# Patient Record
Sex: Female | Born: 1985 | Hispanic: Yes | Marital: Single | Smoking: Former smoker
Health system: Southern US, Community
[De-identification: ages and names within clinical notes are randomized; demographics above are authoritative.]

## PROBLEM LIST (undated history)

## (undated) DIAGNOSIS — F419 Anxiety disorder, unspecified: Secondary | ICD-10-CM

## (undated) DIAGNOSIS — J45909 Unspecified asthma, uncomplicated: Secondary | ICD-10-CM

## (undated) DIAGNOSIS — R519 Headache, unspecified: Secondary | ICD-10-CM

## (undated) DIAGNOSIS — F329 Major depressive disorder, single episode, unspecified: Secondary | ICD-10-CM

## (undated) DIAGNOSIS — T753XXA Motion sickness, initial encounter: Secondary | ICD-10-CM

## (undated) DIAGNOSIS — K219 Gastro-esophageal reflux disease without esophagitis: Secondary | ICD-10-CM

## (undated) DIAGNOSIS — K900 Celiac disease: Secondary | ICD-10-CM

## (undated) DIAGNOSIS — R51 Headache: Secondary | ICD-10-CM

## (undated) DIAGNOSIS — R011 Cardiac murmur, unspecified: Secondary | ICD-10-CM

## (undated) DIAGNOSIS — D649 Anemia, unspecified: Secondary | ICD-10-CM

## (undated) DIAGNOSIS — R569 Unspecified convulsions: Secondary | ICD-10-CM

## (undated) DIAGNOSIS — R87629 Unspecified abnormal cytological findings in specimens from vagina: Secondary | ICD-10-CM

## (undated) DIAGNOSIS — F32A Depression, unspecified: Secondary | ICD-10-CM

## (undated) HISTORY — DX: Unspecified abnormal cytological findings in specimens from vagina: R87.629

## (undated) HISTORY — PX: GALLBLADDER SURGERY: SHX652

## (undated) HISTORY — PX: NO PAST SURGERIES: SHX2092

## (undated) HISTORY — PX: OTHER SURGICAL HISTORY: SHX169

---
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
## 2012-03-12 ENCOUNTER — Emergency Department: Payer: Self-pay | Admitting: Emergency Medicine

## 2012-03-12 LAB — COMPREHENSIVE METABOLIC PANEL
Albumin: 3.6 g/dL (ref 3.4–5.0)
Alkaline Phosphatase: 133 U/L (ref 50–136)
Anion Gap: 6 — ABNORMAL LOW (ref 7–16)
BUN: 9 mg/dL (ref 7–18)
Calcium, Total: 8.8 mg/dL (ref 8.5–10.1)
Co2: 28 mmol/L (ref 21–32)
Creatinine: 0.68 mg/dL (ref 0.60–1.30)
EGFR (African American): >60
EGFR (Non-African Amer.): >60
Glucose: 105 mg/dL — ABNORMAL HIGH (ref 65–99)
Potassium: 3.8 mmol/L (ref 3.5–5.1)
SGOT(AST): 23 U/L (ref 15–37)
SGPT (ALT): 38 U/L
Sodium: 139 mmol/L (ref 136–145)
Total Protein: 7.8 g/dL (ref 6.4–8.2)

## 2012-03-12 LAB — CBC WITH DIFFERENTIAL/PLATELET
Eosinophil #: 0 10*3/uL (ref 0.0–0.7)
HCT: 34.4 % — ABNORMAL LOW (ref 35.0–47.0)
HGB: 10.6 g/dL — ABNORMAL LOW (ref 12.0–16.0)
Lymphocyte %: 15 %
MCH: 24.1 pg — ABNORMAL LOW (ref 26.0–34.0)
MCHC: 30.7 g/dL — ABNORMAL LOW (ref 32.0–36.0)
Monocyte #: 0.5 x10 3/mm (ref 0.2–0.9)
Platelet: 371 10*3/uL (ref 150–440)
RDW: 15.5 % — ABNORMAL HIGH (ref 11.5–14.5)

## 2012-03-12 LAB — URINALYSIS, COMPLETE
Bilirubin,UR: NEGATIVE
Glucose,UR: NEGATIVE mg/dL (ref 0–75)
Ketone: NEGATIVE
Nitrite: NEGATIVE
Ph: 6 (ref 4.5–8.0)
RBC,UR: 2 /HPF (ref 0–5)
Squamous Epithelial: 1
WBC UR: 5 /HPF (ref 0–5)

## 2012-03-12 LAB — PREGNANCY, URINE: Pregnancy Test, Urine: NEGATIVE m[IU]/mL

## 2012-04-08 ENCOUNTER — Emergency Department: Payer: Self-pay | Admitting: Internal Medicine

## 2013-07-08 IMAGING — CT CT HEAD WITHOUT CONTRAST
2 series · 16 of 30 positions shown, 20 images · non-contrast
Comparison: none

REASON FOR EXAM: ? seizure activity, LOC +
COMMENTS:   LMP: couple of days, negative hcg

[Series 2: without · axial · non-contrast · 0.39mm/px · z∈[-136,-16]mm · 13 of 28 slices shown, 17 images]
[im 2/28  brain]
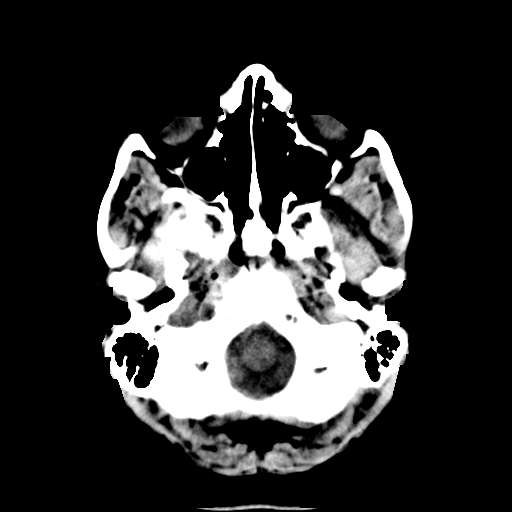
[im 2/28  bone]
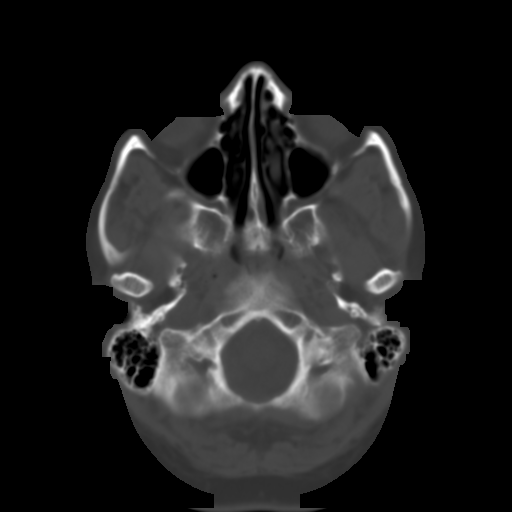
[im 4/28  brain]
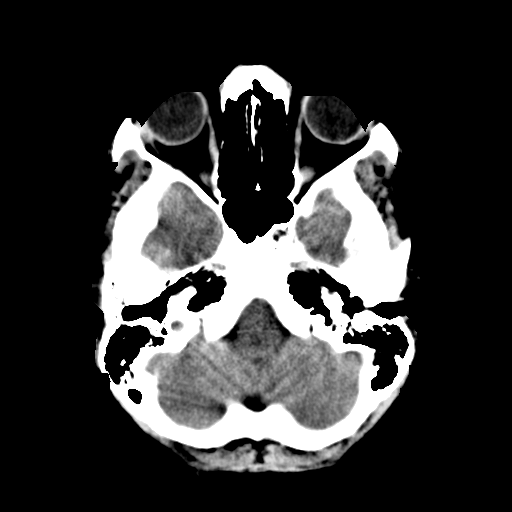
[im 6/28  brain]
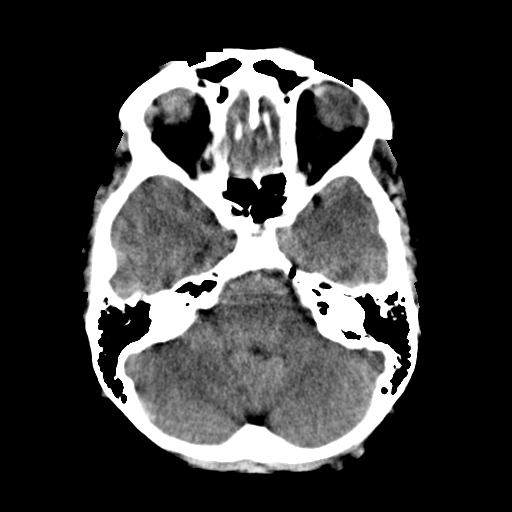
[im 8/28  brain]
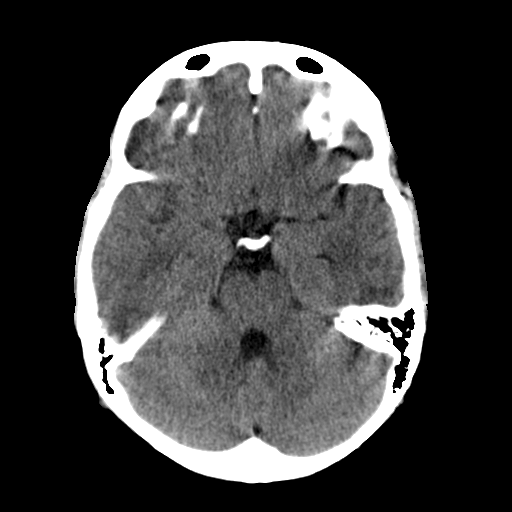
[im 10/28  brain]
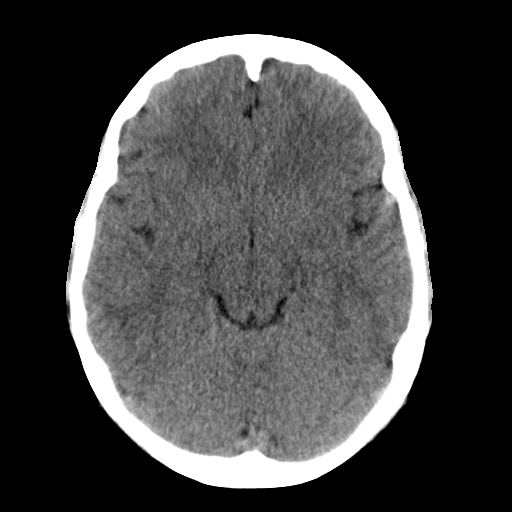
[im 10/28  bone]
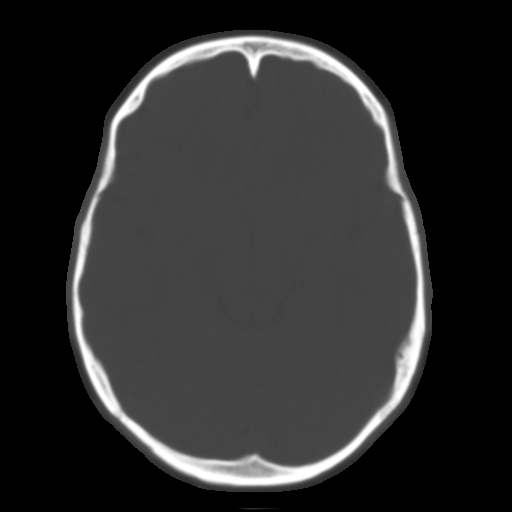
[im 12/28  brain]
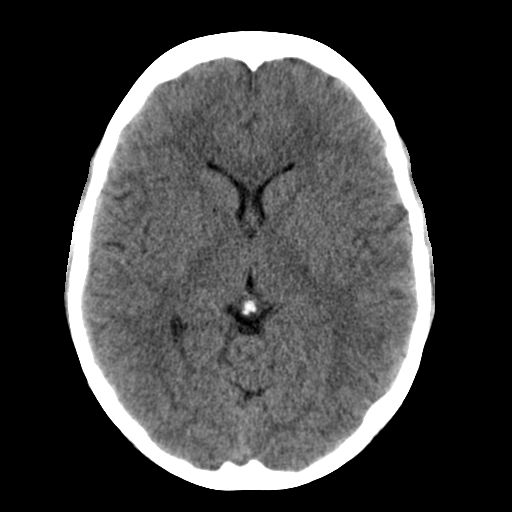
[im 14/28  brain]
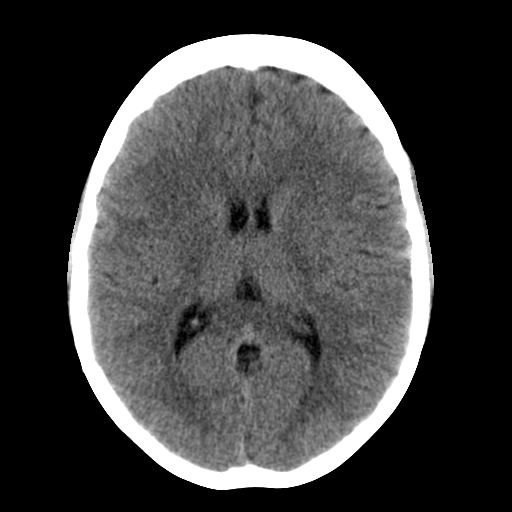
[im 16/28  brain]
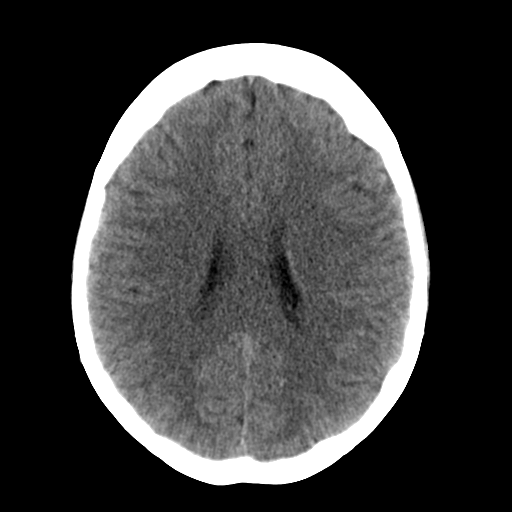
[im 18/28  brain]
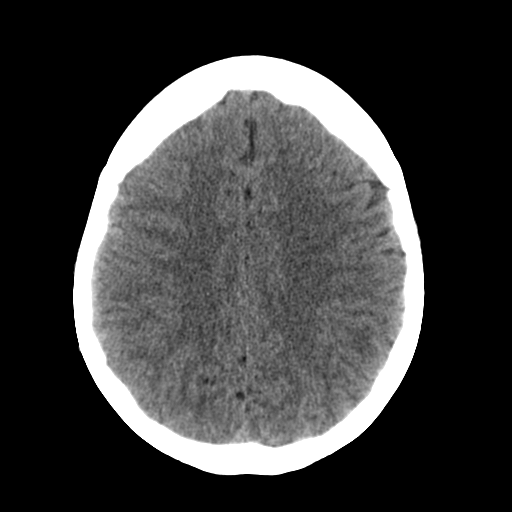
[im 18/28  bone]
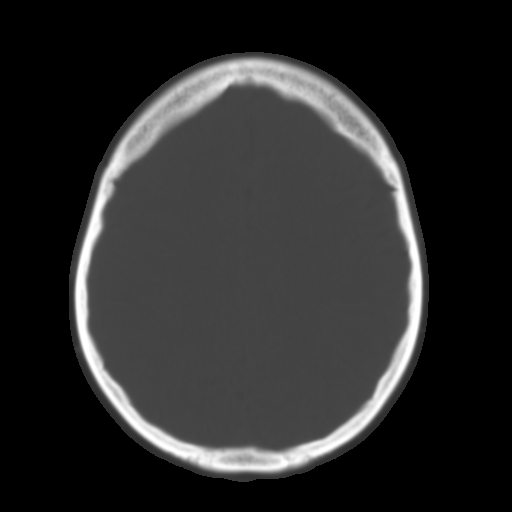
[im 20/28  brain]
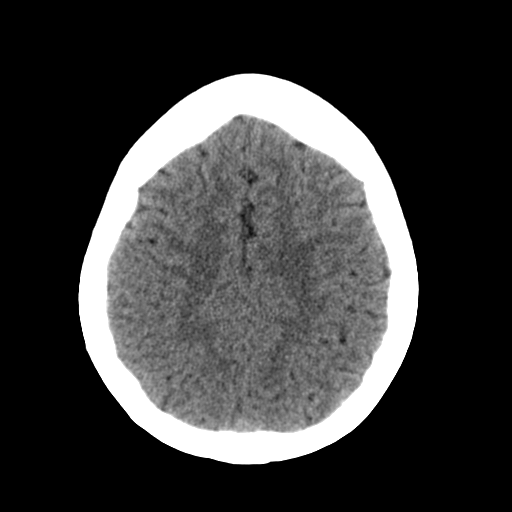
[im 22/28  brain]
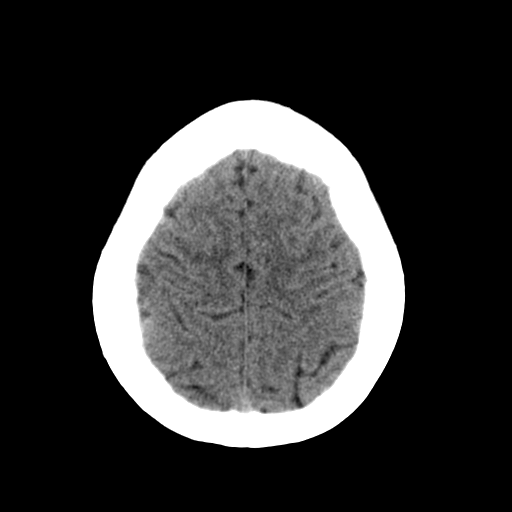
[im 24/28  brain]
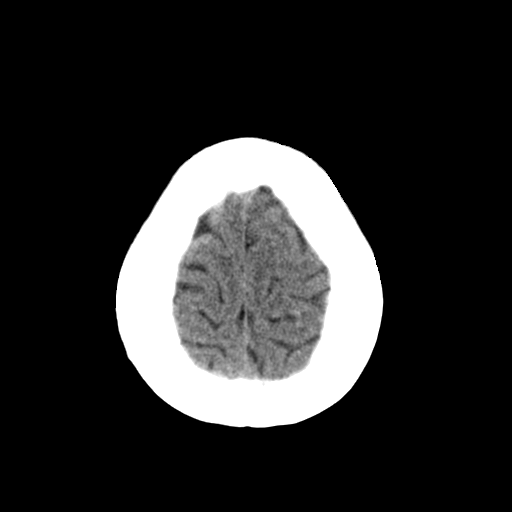
[im 26/28  brain]
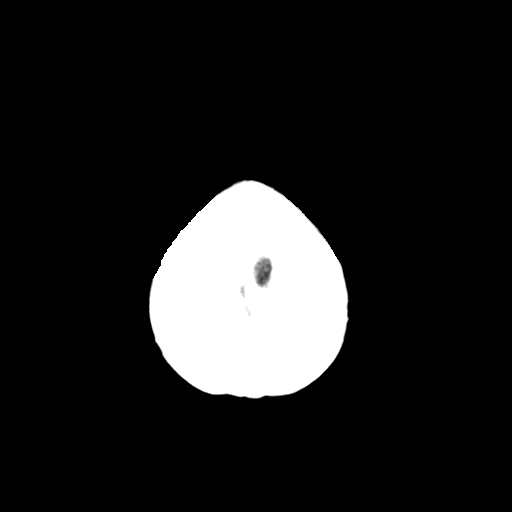
[im 26/28  bone]
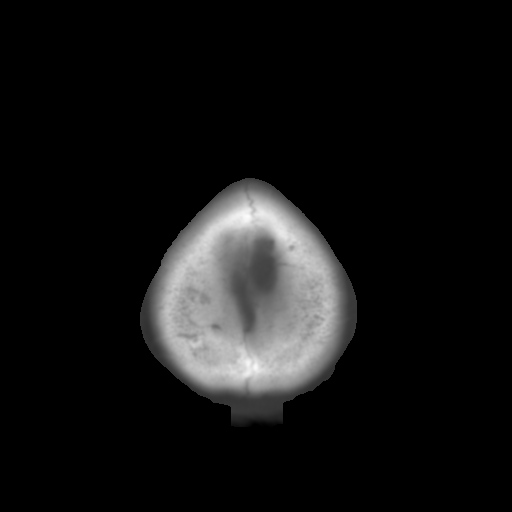

[Series 3: bone · axial · 0.39mm/px · z∈[-136,-96]mm · 3 of 28 slices shown]
[im 2/28  bone]
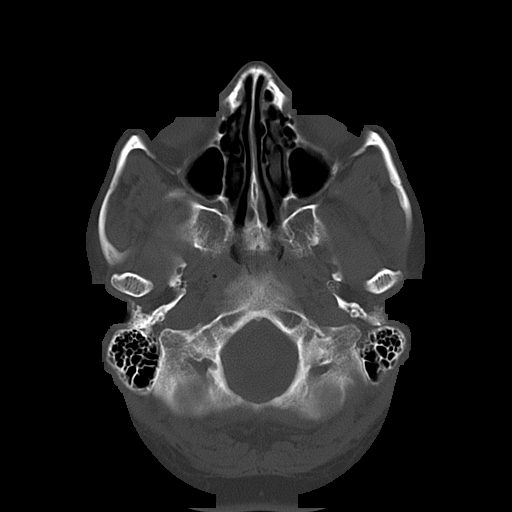
[im 6/28  bone]
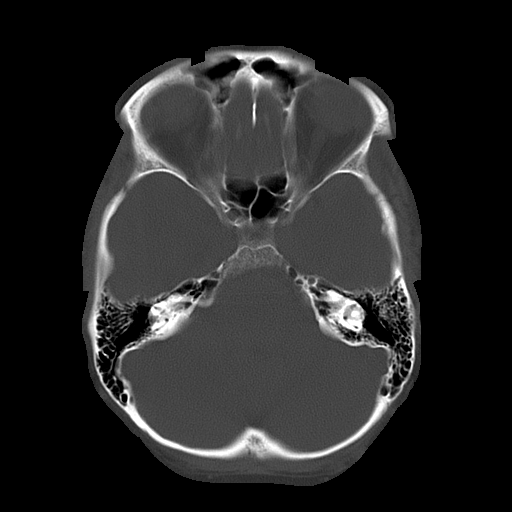
[im 10/28  bone]
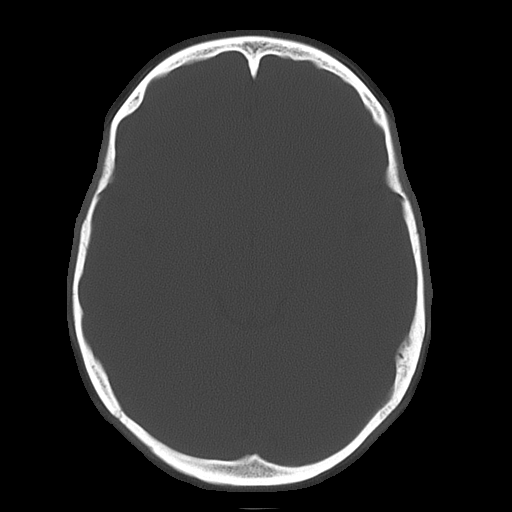

[16 of 30 positions shown; findings below may reference images not displayed]

PROCEDURE:     CT  - CT HEAD WITHOUT CONTRAST  - March 12, 2012  [DATE]

RESULT:     The patient has no previous exam for comparison.

The ventricles and sulci are normal. There is no hemorrhage. There is no
focal mass, mass-effect or midline shift. There is no evidence of edema or
territorial infarct. The bone windows demonstrate normal aeration of the
paranasal sinuses and mastoid air cells. There is no skull fracture
demonstrated.
IMPRESSION: 1. No acute intracranial abnormality.

[REDACTED]

## 2014-08-29 ENCOUNTER — Emergency Department: Payer: Self-pay | Admitting: Emergency Medicine

## 2014-12-22 ENCOUNTER — Emergency Department: Payer: Self-pay | Admitting: Emergency Medicine

## 2015-01-02 LAB — BETA STREP CULTURE(ARMC)

## 2016-04-18 IMAGING — CR DG CHEST 2V
1 series · 2 of 2 positions shown · non-contrast
Comparison: None.

CLINICAL DATA: Productive cough for 3 days. Two day history of
chest pain

EXAM:
CHEST  2 VIEW

[Series 1: dxr chest pa (or ap) and lateral · 0.14mm/px · 2 of 2 slices shown]
[im 1/2]
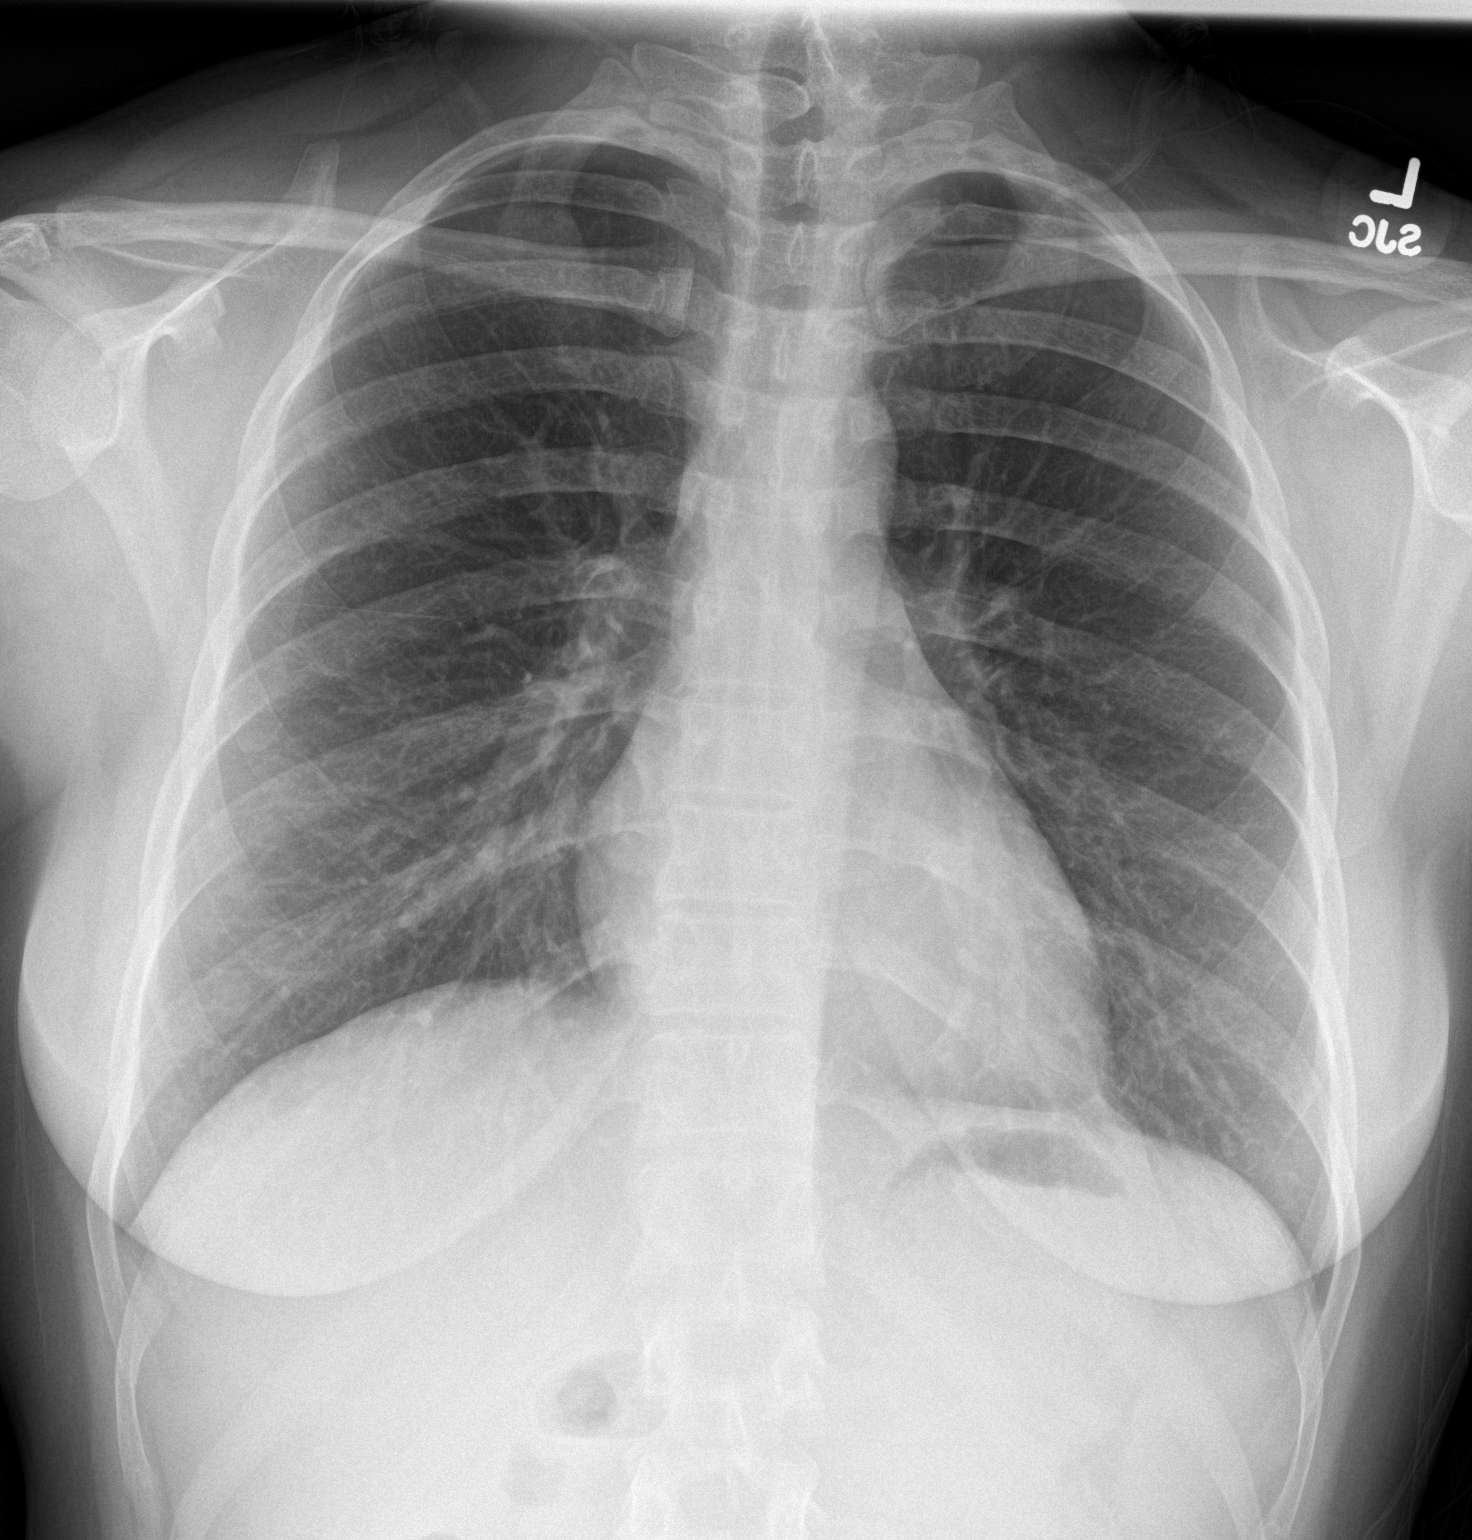
[im 2/2]
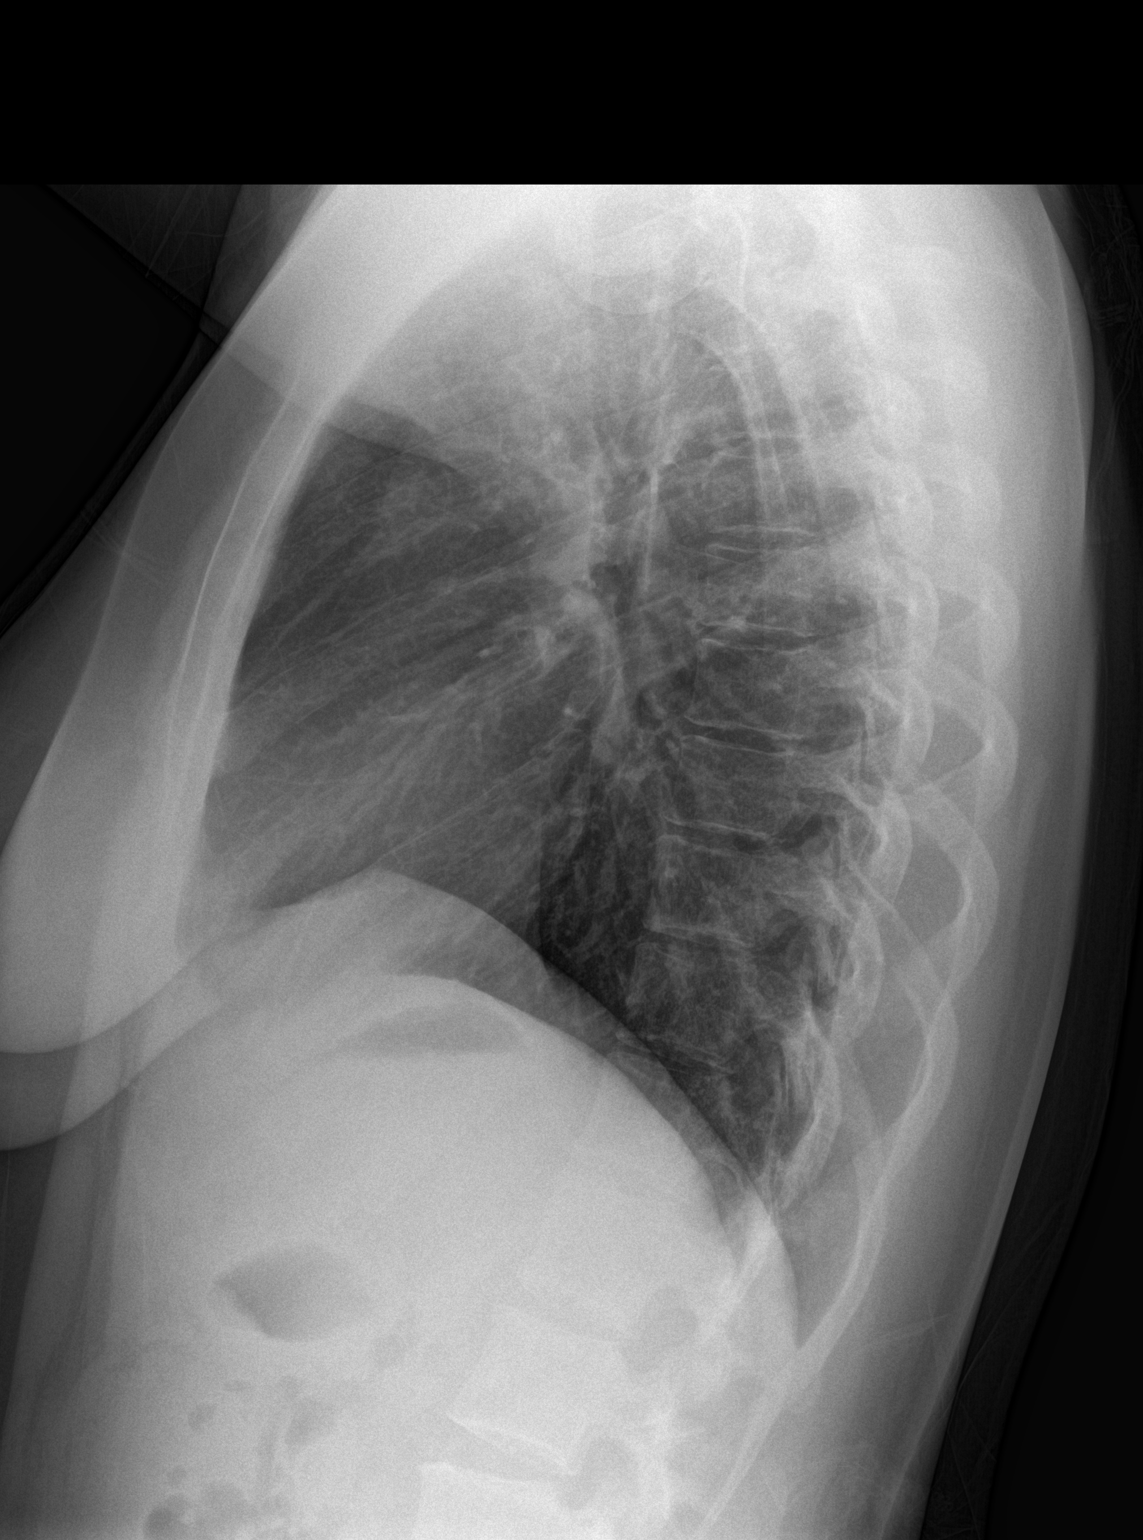

[2 of 2 positions shown; findings below may reference images not displayed]

FINDINGS: Lungs are clear. Heart size and pulmonary vascularity are normal. No
adenopathy. No pneumothorax. No bone lesions.
IMPRESSION: No edema or consolidation.

## 2018-03-29 ENCOUNTER — Emergency Department
Admission: EM | Admit: 2018-03-29 | Discharge: 2018-03-29 | Disposition: A | Discharge status: Home / Self Care | Payer: Medicaid Other | Attending: Emergency Medicine | Admitting: Emergency Medicine

## 2018-03-29 ENCOUNTER — Other Ambulatory Visit: Payer: Self-pay

## 2018-03-29 ENCOUNTER — Encounter: Payer: Self-pay | Admitting: *Deleted

## 2018-03-29 ENCOUNTER — Emergency Department: Payer: Medicaid Other

## 2018-03-29 DIAGNOSIS — O210 Mild hyperemesis gravidarum: Secondary | ICD-10-CM

## 2018-03-29 DIAGNOSIS — R824 Acetonuria: Secondary | ICD-10-CM | POA: Diagnosis not present

## 2018-03-29 DIAGNOSIS — O219 Vomiting of pregnancy, unspecified: Secondary | ICD-10-CM

## 2018-03-29 DIAGNOSIS — O26831 Pregnancy related renal disease, first trimester: Secondary | ICD-10-CM | POA: Diagnosis not present

## 2018-03-29 LAB — CBC WITH DIFFERENTIAL/PLATELET
BASOS ABS: 0.1 10*3/uL (ref 0–0.1)
Basophils Relative: 0 %
EOS PCT: 1 %
Eosinophils Absolute: 0.1 10*3/uL (ref 0–0.7)
HCT: 37.2 % (ref 35.0–47.0)
Hemoglobin: 12.6 g/dL (ref 12.0–16.0)
LYMPHS ABS: 2.4 10*3/uL (ref 1.0–3.6)
Lymphocytes Relative: 18 %
MCH: 29.3 pg (ref 26.0–34.0)
MCHC: 33.9 g/dL (ref 32.0–36.0)
MCV: 86.5 fL (ref 80.0–100.0)
Monocytes Absolute: 0.7 10*3/uL (ref 0.2–0.9)
Monocytes Relative: 5 %
NEUTROS ABS: 10.5 10*3/uL — AB (ref 1.4–6.5)
NEUTROS PCT: 76 %
Platelets: 371 10*3/uL (ref 150–440)
RBC: 4.3 MIL/uL (ref 3.80–5.20)
RDW: 14.4 % (ref 11.5–14.5)
WBC: 13.8 10*3/uL — AB (ref 3.6–11.0)

## 2018-03-29 LAB — URINALYSIS, COMPLETE (UACMP) WITH MICROSCOPIC
Bilirubin Urine: NEGATIVE
GLUCOSE, UA: NEGATIVE mg/dL
Hgb urine dipstick: NEGATIVE
KETONES UR: 20 mg/dL — AB
Leukocytes, UA: NEGATIVE
Nitrite: NEGATIVE
PH: 6 (ref 5.0–8.0)
PROTEIN: NEGATIVE mg/dL
Specific Gravity, Urine: 1.012 (ref 1.005–1.030)

## 2018-03-29 LAB — COMPREHENSIVE METABOLIC PANEL
ALK PHOS: 67 U/L (ref 38–126)
ALT: 17 U/L (ref 0–44)
AST: 21 U/L (ref 15–41)
Albumin: 4.1 g/dL (ref 3.5–5.0)
Anion gap: 9 (ref 5–15)
BUN: 8 mg/dL (ref 6–20)
CALCIUM: 8.9 mg/dL (ref 8.9–10.3)
CHLORIDE: 101 mmol/L (ref 98–111)
CO2: 22 mmol/L (ref 22–32)
CREATININE: 0.56 mg/dL (ref 0.44–1.00)
Glucose, Bld: 92 mg/dL (ref 70–99)
Potassium: 3.2 mmol/L — ABNORMAL LOW (ref 3.5–5.1)
Sodium: 132 mmol/L — ABNORMAL LOW (ref 135–145)
Total Bilirubin: 0.6 mg/dL (ref 0.3–1.2)
Total Protein: 7.8 g/dL (ref 6.5–8.1)

## 2018-03-29 LAB — PREGNANCY, URINE: Preg Test, Ur: POSITIVE — AB

## 2018-03-29 LAB — LIPASE, BLOOD: LIPASE: 26 U/L (ref 11–51)

## 2018-03-29 LAB — HCG, QUANTITATIVE, PREGNANCY: HCG, BETA CHAIN, QUANT, S: 135811 m[IU]/mL — AB (ref <5)

## 2018-03-29 MED ORDER — METOCLOPRAMIDE HCL 10 MG PO TABS: 10.0000 mg | ORAL_TABLET | Freq: Three times a day (TID) | ORAL | 0 refills | Status: DC | PRN

## 2018-03-29 MED ORDER — DEXTROSE-NACL 5-0.9 % IV SOLN
INTRAVENOUS | Status: AC
  Administered 2018-03-29: 20:00:00 via INTRAVENOUS

## 2018-03-29 MED ORDER — METOCLOPRAMIDE HCL 5 MG/ML IJ SOLN
10.0000 mg | Freq: Once | INTRAMUSCULAR | Status: AC
  Administered 2018-03-29: 10 mg via INTRAVENOUS
  Filled 2018-03-29: qty 2

## 2018-03-29 NOTE — ED Notes (Signed)
Pt given apple juice and graham crackers per request. Told pt to go slow to tolerate.

## 2018-03-29 NOTE — ED Triage Notes (Signed)
First Nurse Note:  Arrives c/o nausea and vomiting with pregnancy.  States she does not know how far along she is. States LMP in April 2019.  G5 P3.  Has not been able to schedule an appointment with OB yet.  Denies vaginal bleeding.  C/O intermittent low back and abdominal pain.

## 2018-03-29 NOTE — ED Triage Notes (Signed)
Pt ambulatory to triage.  Pt has nausea and vomiting.  Pt is pregnant.  [id number]  Sx for for 1 week.  No vag bleeding .  Pt has abd pain with low back pain also.  Pt alert.

## 2018-03-29 NOTE — ED Provider Notes (Signed)
[facility] Emergency Department Provider Note  ____________________________________________  Time seen: Approximately 6:07 PM  I have reviewed the triage vital signs and the nursing notes.   HISTORY  Chief Complaint Morning Sickness   HPI Angie Schmidt is a 32 y.o. female no significant past medical history presents for evaluation of nausea and vomiting.  Patient reports that her last menstrual period was in the end of April.  She took a home pregnancy test which was positive.  She has not establish care for this pregnancy.  This is patient's fifth pregnancy.  She has 3 children and her last pregnancy ended up in a abortion.  Patient reports for the last week she has been unable to keep anything down.  Every time she eats or drinks she vomits.  She has had constant nausea. She has been having intermittent mild lower abdominal cramping for one month,  none at this time.  No vaginal bleeding.  No fever or chills, no dysuria or hematuria, no vaginal discharge.  Patient has not established care or had an ultrasound for this pregnancy yet.  PMH None  Allergies Patient has no known allergies.  FH Reviewed and non contributory  Social History Social History   Tobacco Use  . Smoking status: Never Smoker  . Smokeless tobacco: Never Used  Substance Use Topics  . Alcohol use: Never    Frequency: Never  . Drug use: Never    Review of Systems  Constitutional: Negative for fever. Eyes: Negative for visual changes. ENT: Negative for sore throat. Neck: No neck pain  Cardiovascular: Negative for chest pain. Respiratory: Negative for shortness of breath. Gastrointestinal: + abdominal pain, nausea, and vomiting. No diarrhea. Genitourinary: Negative for dysuria. Musculoskeletal: Negative for back pain. Skin: Negative for rash. Neurological: Negative for headaches, weakness or numbness. Psych: No SI or  HI  ____________________________________________   PHYSICAL EXAM:  VITAL SIGNS: ED Triage Vitals  Enc Vitals Group     BP 03/29/18 1754 117/60     Pulse Rate 03/29/18 1754 71     Resp 03/29/18 1754 20     Temp 03/29/18 1754 99.7 F (37.6 C)     Temp Source 03/29/18 1754 Oral     SpO2 03/29/18 1754 100 %     Weight 03/29/18 1755 157 lb (71.2 kg)     Height 03/29/18 1755 5' (1.524 m)     Head Circumference --      Peak Flow --      Pain Score --      Pain Loc --      Pain Edu? --      Excl. in GC? --     Constitutional: Alert and oriented. Well appearing and in no apparent distress. HEENT:      Head: Normocephalic and atraumatic.         Eyes: Conjunctivae are normal. Sclera is non-icteric.       Mouth/Throat: Mucous membranes are moist.       Neck: Supple with no signs of meningismus. Cardiovascular: Regular rate and rhythm. No murmurs, gallops, or rubs. 2+ symmetrical distal pulses are present in all extremities. No JVD. Respiratory: Normal respiratory effort. Lungs are clear to auscultation bilaterally. No wheezes, crackles, or rhonchi.  Gastrointestinal: Soft, non tender, and non distended with positive bowel sounds. No rebound or guarding. Genitourinary: No CVA tenderness. Musculoskeletal: Nontender with normal range of motion in all extremities. No edema, cyanosis, or erythema of extremities. Neurologic: Normal speech and language. Face  is symmetric. Moving all extremities. No gross focal neurologic deficits are appreciated. Skin: Skin is warm, dry and intact. No rash noted. Psychiatric: Mood and affect are normal. Speech and behavior are normal.  ____________________________________________   LABS (all labs ordered are listed, but only abnormal results are displayed)  Labs Reviewed  HCG, QUANTITATIVE, PREGNANCY - Abnormal; Notable for the following components:      Result Value   hCG, Beta Chain, Quant, S 135,811 (*)    All other components within normal limits   CBC WITH DIFFERENTIAL/PLATELET - Abnormal; Notable for the following components:   WBC 13.8 (*)    Neutro Abs 10.5 (*)    All other components within normal limits  COMPREHENSIVE METABOLIC PANEL - Abnormal; Notable for the following components:   Sodium 132 (*)    Potassium 3.2 (*)    All other components within normal limits  URINALYSIS, COMPLETE (UACMP) WITH MICROSCOPIC - Abnormal; Notable for the following components:   Color, Urine YELLOW (*)    APPearance CLEAR (*)    Ketones, ur 20 (*)    Bacteria, UA RARE (*)    All other components within normal limits  PREGNANCY, URINE - Abnormal; Notable for the following components:   Preg Test, Ur POSITIVE (*)    All other components within normal limits  LIPASE, BLOOD   ____________________________________________  EKG  none  ____________________________________________  RADIOLOGY  I have personally reviewed the images performed during this visit and I agree with the Radiologist's read.   Interpretation by Radiologist:  [place] Ob Comp Less 14 Wks  Result Date: 03/29/2018 CLINICAL DATA:  Initial evaluation for acute lower abdominal pain, early pregnancy. EXAM: OBSTETRIC <14 WK US AND TRANSVAGINAL OB US TECHNIQUE: Both transabdominal and transvaginal ultrasound examinations were performed for complete evaluation of the gestation as well as the maternal uterus, adnexal regions, and pelvic cul-de-sac. Transvaginal technique was performed to assess early pregnancy. COMPARISON:  None. FINDINGS: Intrauterine gestational sac: Single Yolk sac:  Present Embryo:  Present Cardiac Activity: Present Heart Rate: 155 bpm CRL:  12.4 mm   7 w   3 d                  US EDC: 11/12/2018 Subchorionic hemorrhage:  None visualized. Maternal uterus/adnexae: Ovaries are normal in appearance bilaterally. Corpus luteal cyst noted on the left. No free fluid. IMPRESSION: 1. Single viable intrauterine pregnancy without complication, estimated gestational age [redacted] weeks  and 3 days by crown-rump length. 2. No other acute maternal uterine or adnexal abnormality identified. Electronically Signed   By: Rise MuBenjamin  McClintock M.D.   On: 03/29/2018 19:31   [place] Ob Transvaginal  Result Date: 03/29/2018 CLINICAL DATA:  Initial evaluation for acute lower abdominal pain, early pregnancy. EXAM: OBSTETRIC <14 WK US AND TRANSVAGINAL OB US TECHNIQUE: Both transabdominal and transvaginal ultrasound examinations were performed for complete evaluation of the gestation as well as the maternal uterus, adnexal regions, and pelvic cul-de-sac. Transvaginal technique was performed to assess early pregnancy. COMPARISON:  None. FINDINGS: Intrauterine gestational sac: Single Yolk sac:  Present Embryo:  Present Cardiac Activity: Present Heart Rate: 155 bpm CRL:  12.4 mm   7 w   3 d                  US EDC: 11/12/2018 Subchorionic hemorrhage:  None visualized. Maternal uterus/adnexae: Ovaries are normal in appearance bilaterally. Corpus luteal cyst noted on the left. No free fluid. IMPRESSION: 1. Single viable intrauterine pregnancy without complication, estimated  gestational age [redacted] weeks and 3 days by crown-rump length. 2. No other acute maternal uterine or adnexal abnormality identified. Electronically Signed   By: Rise Mu M.D.   On: 03/29/2018 19:31      ____________________________________________   PROCEDURES  Procedure(s) performed: None Procedures Critical Care performed:  None ____________________________________________   INITIAL IMPRESSION / ASSESSMENT AND PLAN / ED COURSE  32 y.o. female no significant past medical history presents for evaluation of nausea and vomiting.  Patient with a positive home pregnancy test, LMP end of April, has not had ultrasound to establish care for this pregnancy.  Has been having intermittent lower abdominal cramping but no tenderness or pain at this time, normal vital signs.  No vaginal bleeding.  Patient has had nausea and vomiting for a  week.  Differential diagnosis includes morning sickness versus hyperemesis versus gastritis.  With no tenderness or current pain in her abdomen less likely to be gallbladder, appendicitis, diverticulitis, or pancreatitis.  Will check basic labs, UA, and a transvaginal ultrasound to confirm IUP and rule out ectopic.    _________________________ 8:57 PM on 03/29/2018 -----------------------------------------  UA showing mild ketonuria.  Patient received a liter of D5 normal and Reglan.  She is tolerating p.o.  Labs are within normal limits.  Ultrasound confirms an IUP with gestational age of [redacted] weeks and 5 days.  Patient will be referred to her OB/GYN for further care.  Will provide patient with Reglan for nausea.  Discussed return precautions for vaginal bleeding, signs of dehydration.   As part of my medical decision making, I reviewed the following data within the electronic MEDICAL RECORD NUMBER Nursing notes reviewed and incorporated, Labs reviewed , Radiograph reviewed , Notes from prior ED visits and [place] Controlled Substance Database    Pertinent labs & imaging results that were available during my care of the patient were reviewed by me and considered in my medical decision making (see chart for details).    ____________________________________________   FINAL CLINICAL IMPRESSION(S) / ED DIAGNOSES  Final diagnoses:  Nausea and vomiting during pregnancy  Ketonuria  Morning sickness      NEW MEDICATIONS STARTED DURING THIS VISIT:  ED Discharge Orders        Ordered    metoCLOPramide (REGLAN) 10 MG tablet  Every 8 hours PRN     03/29/18 2056       Note:  This document was prepared using Dragon voice recognition software and may include unintentional dictation errors.    Don Perking, [place], MD 03/29/18 2100

## 2018-10-03 NOTE — L&D Delivery Note (Signed)
Delivery Note At 10:03 AM a viable and healthy female "Carmelo" was delivered via Vaginal, Spontaneous (Presentation: ROA).  APGAR: , ; weight - pending Placenta status: spontaneous.  Cord:  3VC with the following complications: none.  Anesthesia:  None Episiotomy: None Lacerations: None Est. Blood Loss (mL):  300  Mom to postpartum.  Baby to Couplet care / Skin to Skin.  32yo [id number] at 39+1wks with iol for cholestasis of pregnancy. Received GBS ppx and cytotec. SROM for clear fluid and progressed to fully dilated. She pushed over intact perineum and delivered easily without laceration. Head followed quickly and baby placed on maternal abdomen, where he cried after stimulation. Her placenta delivered with fundal massage and postpartum pitocin was given promptly. Her bleeding was minimal and there were no postpartum tears. She tolerated it well.  Christeen DouglasBethany Ramsha Lonigro 11/06/2018, 10:18 AM

## 2018-11-05 ENCOUNTER — Other Ambulatory Visit: Payer: Self-pay

## 2018-11-05 ENCOUNTER — Inpatient Hospital Stay
Admission: EM | Admit: 2018-11-05 | Discharge: 2018-11-08 | DRG: 805 | Disposition: A | Discharge status: Home / Self Care | Payer: Medicaid Other | Attending: Obstetrics and Gynecology | Admitting: Obstetrics and Gynecology

## 2018-11-05 DIAGNOSIS — Z3A39 39 weeks gestation of pregnancy: Secondary | ICD-10-CM

## 2018-11-05 DIAGNOSIS — O9081 Anemia of the puerperium: Secondary | ICD-10-CM | POA: Diagnosis not present

## 2018-11-05 DIAGNOSIS — O26643 Intrahepatic cholestasis of pregnancy, third trimester: Secondary | ICD-10-CM

## 2018-11-05 DIAGNOSIS — D62 Acute posthemorrhagic anemia: Secondary | ICD-10-CM | POA: Diagnosis not present

## 2018-11-05 DIAGNOSIS — K831 Obstruction of bile duct: Secondary | ICD-10-CM | POA: Diagnosis present

## 2018-11-05 DIAGNOSIS — O26613 Liver and biliary tract disorders in pregnancy, third trimester: Secondary | ICD-10-CM

## 2018-11-05 DIAGNOSIS — O99824 Streptococcus B carrier state complicating childbirth: Secondary | ICD-10-CM | POA: Diagnosis present

## 2018-11-05 DIAGNOSIS — O2662 Liver and biliary tract disorders in childbirth: Secondary | ICD-10-CM | POA: Diagnosis present

## 2018-11-05 HISTORY — DX: Liver and biliary tract disorders in pregnancy, third trimester: O26.613

## 2018-11-05 HISTORY — DX: Intrahepatic cholestasis of pregnancy, third trimester: O26.643

## 2018-11-05 HISTORY — DX: Obstruction of bile duct: K83.1

## 2018-11-05 LAB — PROTEIN / CREATININE RATIO, URINE
CREATININE, URINE: 132 mg/dL
PROTEIN CREATININE RATIO: 0.25 mg/mg{creat} — AB (ref 0.00–0.15)
TOTAL PROTEIN, URINE: 33 mg/dL

## 2018-11-05 LAB — COMPREHENSIVE METABOLIC PANEL
ALT: 23 U/L (ref 0–44)
AST: 25 U/L (ref 15–41)
Albumin: 2.9 g/dL — ABNORMAL LOW (ref 3.5–5.0)
Alkaline Phosphatase: 243 U/L — ABNORMAL HIGH (ref 38–126)
Anion gap: 8 (ref 5–15)
BUN: 8 mg/dL (ref 6–20)
CO2: 18 mmol/L — ABNORMAL LOW (ref 22–32)
Calcium: 8.3 mg/dL — ABNORMAL LOW (ref 8.9–10.3)
Chloride: 109 mmol/L (ref 98–111)
Creatinine, Ser: 0.54 mg/dL (ref 0.44–1.00)
GFR calc Af Amer: >60 mL/min (ref >60)
GFR calc non Af Amer: >60 mL/min (ref >60)
Glucose, Bld: 108 mg/dL — ABNORMAL HIGH (ref 70–99)
Potassium: 3.4 mmol/L — ABNORMAL LOW (ref 3.5–5.1)
Sodium: 135 mmol/L (ref 135–145)
Total Bilirubin: 0.6 mg/dL (ref 0.3–1.2)
Total Protein: 7.2 g/dL (ref 6.5–8.1)

## 2018-11-05 LAB — CBC
HCT: 32.6 % — ABNORMAL LOW (ref 36.0–46.0)
Hemoglobin: 10.6 g/dL — ABNORMAL LOW (ref 12.0–15.0)
MCH: 25.5 pg — ABNORMAL LOW (ref 26.0–34.0)
MCHC: 32.5 g/dL (ref 30.0–36.0)
MCV: 78.6 fL — ABNORMAL LOW (ref 80.0–100.0)
Platelets: 328 10*3/uL (ref 150–400)
RBC: 4.15 MIL/uL (ref 3.87–5.11)
RDW: 15.1 % (ref 11.5–15.5)
WBC: 7.7 10*3/uL (ref 4.0–10.5)
nRBC: 0 % (ref 0.0–0.2)

## 2018-11-05 LAB — TYPE AND SCREEN
ABO/RH(D): O POS
Antibody Screen: NEGATIVE

## 2018-11-05 MED ORDER — LACTATED RINGERS IV SOLN: 500.0000 mL | INTRAVENOUS | Status: DC | PRN

## 2018-11-05 MED ORDER — OXYTOCIN 40 UNITS IN NORMAL SALINE INFUSION - SIMPLE MED
1.0000 m[IU]/min | INTRAVENOUS | Status: DC
  Administered 2018-11-05: 2 m[IU]/min via INTRAVENOUS
  Filled 2018-11-05: qty 1000

## 2018-11-05 MED ORDER — FENTANYL CITRATE (PF) 100 MCG/2ML IJ SOLN
50.0000 ug | INTRAMUSCULAR | Status: DC | PRN
  Administered 2018-11-06 (×3): 50 ug via INTRAVENOUS
  Filled 2018-11-05 (×3): qty 2

## 2018-11-05 MED ORDER — ONDANSETRON HCL 4 MG/2ML IJ SOLN: 4.0000 mg | Freq: Four times a day (QID) | INTRAMUSCULAR | Status: DC | PRN

## 2018-11-05 MED ORDER — ACETAMINOPHEN 325 MG PO TABS
650.0000 mg | ORAL_TABLET | ORAL | Status: DC | PRN
  Administered 2018-11-05: 650 mg via ORAL
  Filled 2018-11-05: qty 2

## 2018-11-05 MED ORDER — LIDOCAINE HCL (PF) 1 % IJ SOLN
INTRAMUSCULAR | Status: AC
  Filled 2018-11-05: qty 30

## 2018-11-05 MED ORDER — MISOPROSTOL 200 MCG PO TABS
ORAL_TABLET | ORAL | Status: AC
  Filled 2018-11-05: qty 4

## 2018-11-05 MED ORDER — LIDOCAINE HCL (PF) 1 % IJ SOLN: 30.0000 mL | INTRAMUSCULAR | Status: DC | PRN

## 2018-11-05 MED ORDER — OXYTOCIN BOLUS FROM INFUSION
500.0000 mL | Freq: Once | INTRAVENOUS | Status: DC
  Administered 2018-11-06: 500 mL via INTRAVENOUS

## 2018-11-05 MED ORDER — TERBUTALINE SULFATE 1 MG/ML IJ SOLN: 0.2500 mg | Freq: Once | INTRAMUSCULAR | Status: DC | PRN

## 2018-11-05 MED ORDER — AMMONIA AROMATIC IN INHA
RESPIRATORY_TRACT | Status: AC
  Filled 2018-11-05: qty 10

## 2018-11-05 MED ORDER — PENICILLIN G 3 MILLION UNITS IVPB - SIMPLE MED
3.0000 10*6.[IU] | INTRAVENOUS | Status: DC
  Administered 2018-11-05 – 2018-11-06 (×3): 3 10*6.[IU] via INTRAVENOUS
  Filled 2018-11-05 (×4): qty 100
  Filled 2018-11-05: qty 3
  Filled 2018-11-05 (×4): qty 100

## 2018-11-05 MED ORDER — OXYTOCIN 10 UNIT/ML IJ SOLN
INTRAMUSCULAR | Status: AC
  Filled 2018-11-05: qty 2

## 2018-11-05 MED ORDER — SODIUM CHLORIDE 0.9 % IV SOLN
5.0000 10*6.[IU] | Freq: Once | INTRAVENOUS | Status: AC
  Administered 2018-11-05: 5 10*6.[IU] via INTRAVENOUS
  Filled 2018-11-05: qty 5

## 2018-11-05 MED ORDER — SOD CITRATE-CITRIC ACID 500-334 MG/5ML PO SOLN: 30.0000 mL | ORAL | Status: DC | PRN

## 2018-11-05 MED ORDER — OXYTOCIN 40 UNITS IN NORMAL SALINE INFUSION - SIMPLE MED
2.5000 [IU]/h | INTRAVENOUS | Status: DC
  Administered 2018-11-06: 2.5 [IU]/h via INTRAVENOUS

## 2018-11-05 MED ORDER — LACTATED RINGERS IV SOLN
INTRAVENOUS | Status: DC
  Administered 2018-11-05 – 2018-11-06 (×3): via INTRAVENOUS

## 2018-11-05 NOTE — H&P (Signed)
OB History & Physical   History of Present Illness:  Chief Complaint: IOL due to cholestasis of pregnancy.   HPI:  Angie Schmidt is a 33 y.o. [phone] female at [redacted]w[redacted]d dated by [place] at [facility]  She presents to L&D for induction of labor.  C/o constant itching for over 1 week, Itching is worse at nighttime and worse on her hands and feet. Pt did not come for scheduled lab and prenatal visit due to transportation issues. Reports fetal movement and irreg UCs. Denies LOF or VB. D/w Dr Elesa Massed, pt to be admitted for IOL with presumed cholestasis.    Pregnancy Issues: 1. Abnormal Pap, NILM with + HPV- repeat 1 year 2. Pre preg BMI 30 3. UTI x 2, + GBS bacteriuria 4. Hx Anemia, iron BID 5. Hx MJ use 6. Hx Anxiety/depression, no meds during preg 7. Hx Seizure 77yrs ago, never seen or eval by Neuro.  8. Transfer of care from [facility] at 25wks   Maternal Medical History:  History reviewed. No pertinent past medical history.  History reviewed. No pertinent surgical history.  No Known Allergies  Prior to Admission medications   Medication Sig Start Date End Date Taking? Authorizing Provider  metoCLOPramide (REGLAN) 10 MG tablet Take 1 tablet (10 mg total) by mouth every 8 (eight) hours as needed for up to 3 days for nausea. 03/29/18 04/01/18  Nita Sickle, MD     Prenatal care site: [facility]   Social History: She  reports that she has never smoked. She has never used smokeless tobacco. She reports that she does not drink alcohol or use drugs.  Family History:  Mother: lupus, HTN, colon cancer, DVT; Father: HTN, high cholesterol.   Review of Systems: A full review of systems was performed and negative except as noted in the HPI.     Physical Exam:  Vital Signs: BP 135/84 (BP Location: Left Arm)   Pulse 93   Temp 98.8 F (37.1 C) (Oral)   Resp 16   Ht 5\' 3"  (1.6 m)   Wt 77.6 kg   LMP 01/10/2018 (Approximate)   BMI 30.29 kg/m    General: no acute distress.  HEENT:  normocephalic, atraumatic Heart: regular rate & rhythm.  No murmurs/rubs/gallops Lungs: clear to auscultation bilaterally, normal respiratory effort Abdomen: soft, gravid, non-tender;  EFW: 7.5lbs Pelvic: Cook cath placed and balloons filled with sterile water, 52ml uterine, 70ml vaginal . Pt tolerated well.   External: Normal external female genitalia  Cervix: Dilation: 3 / Effacement (%): 50 / Station: -2 posterior, soft.    Extremities: non-tender, symmetric, no edema bilaterally.  DTRs: 2+  Neurologic: Alert & oriented x 3.    Results for orders placed or performed during the hospital encounter of 11/05/18 (from the past 24 hour(s))  CBC     Status: Abnormal   Collection Time: 11/05/18  5:31 PM  Result Value Ref Range   WBC 7.7 4.0 - 10.5 K/uL   RBC 4.15 3.87 - 5.11 MIL/uL   Hemoglobin 10.6 (L) 12.0 - 15.0 g/dL   HCT 03.5 (L) 00.9 - 38.1 %   MCV 78.6 (L) 80.0 - 100.0 fL   MCH 25.5 (L) 26.0 - 34.0 pg   MCHC 32.5 30.0 - 36.0 g/dL   RDW 82.9 93.7 - 16.9 %   Platelets 328 150 - 400 K/uL   nRBC 0.0 0.0 - 0.2 %  Type and screen [facility]     Status: None   Collection  Time: 11/05/18  5:31 PM  Result Value Ref Range   ABO/RH(D) O POS    Antibody Screen NEG    Sample Expiration      11/08/2018 Performed at [facility], [address] [place], [place] [address]   Comprehensive metabolic panel     Status: Abnormal   Collection Time: 11/05/18  5:31 PM  Result Value Ref Range   Sodium 135 135 - 145 mmol/L   Potassium 3.4 (L) 3.5 - 5.1 mmol/L   Chloride 109 98 - 111 mmol/L   CO2 18 (L) 22 - 32 mmol/L   Glucose, Bld 108 (H) 70 - 99 mg/dL   BUN 8 6 - 20 mg/dL   Creatinine, Ser 5.43 0.44 - 1.00 mg/dL   Calcium 8.3 (L) 8.9 - 10.3 mg/dL   Total Protein 7.2 6.5 - 8.1 g/dL   Albumin 2.9 (L) 3.5 - 5.0 g/dL   AST 25 15 - 41 U/L   ALT 23 0 - 44 U/L   Alkaline Phosphatase 243 (H) 38 - 126 U/L   Total Bilirubin 0.6 0.3 - 1.2 mg/dL   GFR calc non Af Amer  >60 >60 mL/min   GFR calc Af Amer >60 >60 mL/min   Anion gap 8 5 - 15   Bile acids: pending  Pertinent Results:  Prenatal Labs: Blood type/Rh  O Pos  Antibody screen neg  Rubella Immune  Varicella Immune  RPR NR  HBsAg Neg  HIV NR  GC neg  Chlamydia neg  Genetic screening  declined per [facility] recs  1 hour GTT  128  GBS  Positive   FHT: 130bpm, moderate variability, + accels, no decels TOCO: Irregular, q3-68min.  SVE:  Dilation: 3 / Effacement (%): 50 / Station: -2    Cephalic by leopolds  No results found.  Assessment:  Angie Schmidt is a 33 y.o. [phone] female at [redacted]w[redacted]d with IOL for cholestasis of pregnancy.   Plan:  1. Admit to Labor & Delivery; consents reviewed and obtained  2. Fetal Well being  - Fetal Tracing: Cat I tracing - Group B Streptococcus ppx indicated: Positive, PCN started at 1835 - Presentation: cephalic confirmed by SVE   3. Routine OB: - Prenatal labs reviewed, as above - Rh O Pos - CBC, T&S, RPR on admit - Clear fluids, IVF  4. Induction of Labor -  Contractions: external toco in place -  Pelvis proven to 8#3 -  Plan for induction with cook catheter and pitocin, AROM.  -  Plan for continuous fetal monitoring  -  Maternal pain control as desired; reviewed options available including IVPM, nitrous and epidural.  - Anticipate vaginal delivery  5. Post Partum Planning: - Infant feeding: undecided - Contraception: desires BTL, consent signed 10/10/18.   Randa Ngo, CNM 11/05/18 7:28 PM

## 2018-11-06 LAB — RPR: RPR Ser Ql: NONREACTIVE

## 2018-11-06 LAB — BILE ACIDS, TOTAL: Bile Acids Total: 36.4 umol/L — ABNORMAL HIGH (ref 0.0–10.0)

## 2018-11-06 MED ORDER — DIBUCAINE 1 % RE OINT: 1.0000 "application " | TOPICAL_OINTMENT | RECTAL | Status: DC | PRN

## 2018-11-06 MED ORDER — DIPHENHYDRAMINE HCL 25 MG PO CAPS
25.0000 mg | ORAL_CAPSULE | Freq: Four times a day (QID) | ORAL | Status: DC | PRN
  Administered 2018-11-07: 25 mg via ORAL
  Filled 2018-11-06: qty 1

## 2018-11-06 MED ORDER — SODIUM CHLORIDE 0.9% FLUSH: 3.0000 mL | INTRAVENOUS | Status: DC | PRN

## 2018-11-06 MED ORDER — COCONUT OIL OIL
1.0000 "application " | TOPICAL_OIL | Status: DC | PRN
  Filled 2018-11-06: qty 120

## 2018-11-06 MED ORDER — MEASLES, MUMPS & RUBELLA VAC IJ SOLR: 0.5000 mL | Freq: Once | INTRAMUSCULAR | Status: DC

## 2018-11-06 MED ORDER — BENZOCAINE-MENTHOL 20-0.5 % EX AERO
1.0000 "application " | INHALATION_SPRAY | CUTANEOUS | Status: DC | PRN
  Administered 2018-11-06 (×2): 1 via TOPICAL
  Filled 2018-11-06 (×2): qty 56

## 2018-11-06 MED ORDER — SIMETHICONE 80 MG PO CHEW: 80.0000 mg | CHEWABLE_TABLET | ORAL | Status: DC | PRN

## 2018-11-06 MED ORDER — ZOLPIDEM TARTRATE 5 MG PO TABS: 5.0000 mg | ORAL_TABLET | Freq: Every evening | ORAL | Status: DC | PRN

## 2018-11-06 MED ORDER — TETANUS-DIPHTH-ACELL PERTUSSIS 5-2.5-18.5 LF-MCG/0.5 IM SUSP
0.5000 mL | Freq: Once | INTRAMUSCULAR | Status: AC
  Administered 2018-11-07: 0.5 mL via INTRAMUSCULAR
  Filled 2018-11-06: qty 0.5

## 2018-11-06 MED ORDER — LACTATED RINGERS IV SOLN: INTRAVENOUS | Status: DC

## 2018-11-06 MED ORDER — SODIUM CHLORIDE 0.9% FLUSH: 3.0000 mL | Freq: Two times a day (BID) | INTRAVENOUS | Status: DC

## 2018-11-06 MED ORDER — OXYCODONE HCL 5 MG PO TABS
5.0000 mg | ORAL_TABLET | ORAL | Status: DC | PRN
  Administered 2018-11-06: 5 mg via ORAL
  Filled 2018-11-06: qty 1

## 2018-11-06 MED ORDER — PRENATAL MULTIVITAMIN CH
1.0000 | ORAL_TABLET | Freq: Every day | ORAL | Status: DC
  Administered 2018-11-06 – 2018-11-08 (×3): 1 via ORAL
  Filled 2018-11-06 (×3): qty 1

## 2018-11-06 MED ORDER — OXYCODONE HCL 5 MG PO TABS
5.0000 mg | ORAL_TABLET | ORAL | Status: DC | PRN
  Administered 2018-11-06 – 2018-11-07 (×4): 10 mg via ORAL
  Filled 2018-11-06 (×5): qty 2

## 2018-11-06 MED ORDER — FAMOTIDINE 20 MG PO TABS: 40.0000 mg | ORAL_TABLET | Freq: Once | ORAL | Status: DC

## 2018-11-06 MED ORDER — HYDROXYZINE HCL 25 MG PO TABS
50.0000 mg | ORAL_TABLET | Freq: Once | ORAL | Status: AC
  Administered 2018-11-06: 50 mg via ORAL
  Filled 2018-11-06: qty 2

## 2018-11-06 MED ORDER — ONDANSETRON HCL 4 MG PO TABS: 4.0000 mg | ORAL_TABLET | ORAL | Status: DC | PRN

## 2018-11-06 MED ORDER — SODIUM CHLORIDE 0.9 % IV SOLN: 250.0000 mL | INTRAVENOUS | Status: DC | PRN

## 2018-11-06 MED ORDER — ACETAMINOPHEN 325 MG PO TABS
650.0000 mg | ORAL_TABLET | ORAL | Status: DC | PRN
  Administered 2018-11-06 – 2018-11-07 (×5): 650 mg via ORAL
  Filled 2018-11-06 (×6): qty 2

## 2018-11-06 MED ORDER — ONDANSETRON HCL 4 MG/2ML IJ SOLN: 4.0000 mg | INTRAMUSCULAR | Status: DC | PRN

## 2018-11-06 MED ORDER — WITCH HAZEL-GLYCERIN EX PADS: 1.0000 "application " | MEDICATED_PAD | CUTANEOUS | Status: DC | PRN

## 2018-11-06 MED ORDER — IBUPROFEN 600 MG PO TABS
600.0000 mg | ORAL_TABLET | Freq: Four times a day (QID) | ORAL | Status: DC
  Administered 2018-11-06 – 2018-11-08 (×9): 600 mg via ORAL
  Filled 2018-11-06 (×9): qty 1

## 2018-11-06 MED ORDER — FLEET ENEMA 7-19 GM/118ML RE ENEM: 1.0000 | ENEMA | Freq: Every day | RECTAL | Status: DC | PRN

## 2018-11-06 MED ORDER — METOCLOPRAMIDE HCL 10 MG PO TABS
10.0000 mg | ORAL_TABLET | Freq: Once | ORAL | Status: DC
  Filled 2018-11-06: qty 1

## 2018-11-06 MED ORDER — BISACODYL 10 MG RE SUPP: 10.0000 mg | Freq: Every day | RECTAL | Status: DC | PRN

## 2018-11-06 MED ORDER — SENNOSIDES-DOCUSATE SODIUM 8.6-50 MG PO TABS
2.0000 | ORAL_TABLET | ORAL | Status: DC
  Administered 2018-11-07 – 2018-11-08 (×2): 2 via ORAL
  Filled 2018-11-06 (×2): qty 2

## 2018-11-06 NOTE — Discharge Instructions (Signed)
Care After Vaginal Delivery °Congratulations on your new baby!! ° °Refer to this sheet in the next few weeks. These discharge instructions provide you with information on caring for yourself after delivery. Your caregiver may also give you specific instructions. Your treatment has been planned according to the most current medical practices available, but problems sometimes occur. Call your caregiver if you have any problems or questions after you go home. ° °HOME CARE INSTRUCTIONS °· Take over-the-counter or prescription medicines only as directed by your caregiver or pharmacist. °· Do not drink alcohol, especially if you are breastfeeding or taking medicine to relieve pain. °· Do not chew or smoke tobacco. °· Do not use illegal drugs. °· Continue to use good perineal care. Good perineal care includes: °¨ Wiping your perineum from front to back. °¨ Keeping your perineum clean. °· Do not use tampons or douche until your caregiver says it is okay. °· Shower, wash your hair, and take tub baths as directed by your caregiver. °· Wear a well-fitting bra that provides breast support. °· Eat healthy foods. °· Drink enough fluids to keep your urine clear or pale yellow. °· Eat high-fiber foods such as whole grain cereals and breads, brown rice, beans, and fresh fruits and vegetables every day. These foods may help prevent or relieve constipation. °· Follow your caregiver's recommendations regarding resumption of activities such as climbing stairs, driving, lifting, exercising, or traveling. Specifically, no driving for two weeks, so that you are comfortable reacting quickly in an emergency. °· Talk to your caregiver about resuming sexual activities. Resumption of sexual activities is dependent upon your risk of infection, your rate of healing, and your comfort and desire to resume sexual activity. Usually we recommend waiting about six weeks, or until your bleeding stops and you are interested in sex. °· Try to have someone  help you with your household activities and your newborn for at least a few days after you leave the hospital. Even longer is better. °· Rest as much as possible. Try to rest or take a nap when your newborn is sleeping. Sleep deprivation can be very hard after delivery. °· Increase your activities gradually. °· Keep all of your scheduled postpartum appointments. It is very important to keep your scheduled follow-up appointments. At these appointments, your caregiver will be checking to make sure that you are healing physically and emotionally. ° °SEEK MEDICAL CARE IF:  °· You are passing large clots from your vagina.  °· You have a foul smelling discharge from your vagina. °· You have trouble urinating. °· You are urinating frequently. °· You have pain when you urinate. °· You have a change in your bowel movements. °· You have increasing redness, pain, or swelling near your vaginal incision (episiotomy) or vaginal tear. °· You have pus draining from your episiotomy or vaginal tear. °· Your episiotomy or vaginal tear is separating. °· You have painful, hard, or reddened breasts. °· You have a severe headache. °· You have blurred vision or see spots. °· You feel sad or depressed. °· You have thoughts of hurting yourself or your newborn. °· You have questions about your care, the care of your newborn, or medicines. °· You are dizzy or light-headed. °· You have a rash. °· You have nausea or vomiting. °· You were breastfeeding and have not had a menstrual period within 12 weeks after you stopped breastfeeding. °· You are not breastfeeding and have not had a menstrual period by the 12th week after delivery. °· You   have a fever. ° °SEEK IMMEDIATE MEDICAL CARE IF:  °· You have persistent pain. °· You have chest pain. °· You have shortness of breath. °· You faint. °· You have leg pain. °· You have stomach pain. °· Your vaginal bleeding saturates two or more sanitary pads in 1 hour. ° °MAKE SURE YOU:  °· Understand these  instructions. °· Will get help right away if you are not doing well or get worse. °·  °Document Released: 09/16/2000 Document Revised: 02/03/2014 Document Reviewed: 05/16/2012 ° °[employer]® Patient Information ©2015 [employer]. This information is not intended to replace advice given to you by your health care provider. Make sure you discuss any questions you have with your health care provider. ° °

## 2018-11-06 NOTE — Discharge Summary (Signed)
Obstetrical Discharge Summary  Patient Name: Angie Schmidt DOB: 1985/10/09 MRN: [id number]  Date of Admission: 11/05/2018 Date of Discharge: 11/08/2018  [facility] Gestational Age at [facility]: 3833w1d   Antepartum complications:   1. Abnormal Pap, NILM with + HPV- repeat 1 year 2. Pre preg BMI 30 3. UTI x 2, + GBS bacteriuria 4. Hx Anemia, iron BID 5. Hx MJ use 6. Hx Anxiety/depression, no meds during preg 7. Hx Seizure 7742yrs ago, never seen or eval by Neuro.  8. [facility] at 25[facility]  Admitting Diagnosis:  Secondary Diagnosis: Patient Active Problem List   Diagnosis Date Noted  . Cholestasis during pregnancy in third trimester 11/05/2018    Augmentation: Pitocin, Cytotec and Foley Balloon Complications: None Intrapartum complications/course:  Normal NSVD  Date of Delivery: 11/06/18 Delivered By: Christeen DouglasBethany Beasley Delivery Type: spontaneous vaginal delivery Anesthesia: none  Placenta: Spontaneous Laceration:  Episiotomy: none Newborn Data: Live born female "Angie Schmidt" Birth Weight:  Pending  APGAR: 8, 9  Newborn Delivery   Birth date/time:  11/06/2018 10:03:00 Delivery type:  Vaginal, Spontaneous       Discharge Physical Exam:  BP 114/60 (BP Location: Left Arm)   Pulse (!) 57   Temp 98.6 F (37 C) (Oral)   Resp 18   Ht 5\' 3"  (1.6 m)   Wt 77.6 kg   LMP 01/10/2018 (Approximate)   SpO2 99%   Breastfeeding Unknown   BMI 30.29 kg/m   General: NAD CV: RRR Pulm: CTABL, nl effort ABD: s/nd/nt, fundus firm and below the umbilicus Lochia: moderate Incision: c/d/i  DVT Evaluation: LE non-ttp, no evidence of DVT on exam.  Hemoglobin  Date Value Ref Range Status  11/07/2018 8.8 (L) 12.0 - 15.0 g/dL Final   HGB  Date Value Ref Range Status  03/12/2012 10.6 (L) 12.0 - 16.0 g/dL Final   HCT  Date Value Ref Range Status  11/07/2018 27.9 (L) 36.0 - 46.0 % Final  03/12/2012 34.4 (L) 35.0 - 47.0 % Final    Post partum course:  uncomplicated Postpartum Procedures: desired postpartum BTL- has Preop next week.  Disposition: stable, discharge to home. Baby Feeding: breastmilk Baby Disposition: home with mom  Rh Immune globulin given: n/a Rubella vaccine given:  n/a Tdap vaccine given in AP or PP setting: 08/28/18 Declined Flu vaccine given in AP or PP setting: declined 08/02/2018  Contraception: BTL planned  Prenatal Labs: Blood type/Rh --/--/O POS (02/03 1731)  Antibody screen neg  Rubella Immune  Varicella Immune  RPR NR  HBsAg Neg  HIV NR  GC neg  Chlamydia neg  Genetic screening negative  1 hour GTT 128  3 hour GTT n/a  GBS Positive- adequately treated     Plan:  Angie Schmidt was discharged to home in good condition. [facility] 6 weeks   Discharge Medications: Allergies as of 11/08/2018   No Known Allergies     Medication List    STOP taking these medications   metoCLOPramide 10 MG tablet Commonly known as:  REGLAN     TAKE these medications   acetaminophen 325 MG tablet Commonly known as:  TYLENOL Take 2 tablets (650 mg total) by mouth every 4 (four) hours as needed (for pain scale < 4).   bisacodyl 10 MG suppository Commonly known as:  DULCOLAX Place 1 suppository (10 mg total) rectally daily as needed for moderate constipation.   ferrous sulfate 325 (65 FE) MG  tablet Take 1 tablet (325 mg total) by mouth 2 (two) times daily with a meal. For anemia, take with Vitamin C   ibuprofen 600 MG tablet Commonly known as:  ADVIL,MOTRIN Take 1 tablet (600 mg total) by mouth every 6 (six) hours.   prenatal multivitamin Tabs tablet Take 1 tablet by mouth daily at 12 noon.   senna-docusate 8.6-50 MG tablet Commonly known as:  Senokot-S Take 2 tablets by mouth daily.   zolpidem 5 MG tablet Commonly known as:  AMBIEN Take 1 tablet (5 mg total) by mouth at bedtime as needed for sleep.       Follow-up  Information    Christeen Douglas, MD. Go on 11/16/2018.   Specialty:  Obstetrics and Gynecology Why:  For pre-op visit on 11/18/18 @ 9:45am with Dr. Dalbert Garnet. Contact information: [address] [place] [address] [phone]           Signed: Randa Ngo, CNM 11/08/2018 10:13 AM

## 2018-11-06 NOTE — Progress Notes (Signed)
Labor Progress Note  Angie RangerCarmen G Schmidt is a 33 y.o. [id number] at 633w1d by ultrasound admitted for induction of labor due to cholestasis.  Subjective: comfortable, feeling contractions more strongly  Objective: BP 101/61 (BP Location: Left Arm)   Pulse 62   Temp 98 F (36.7 C) (Oral)   Resp 16   Ht 5\' 3"  (1.6 m)   Wt 77.6 kg   LMP 01/10/2018 (Approximate)   BMI 30.29 kg/m  Notable VS details: reviewed  Fetal Assessment: FHT:  FHR: 135 bpm, variability: moderate,  accelerations:  Present,  decelerations:  Absent Category/reactivity:  Category I UC:   regular, every 12-3 minutes, Pitocin at 6420mu/min SVE:   Dilation: 5 Effacement (%): 70 Cervical Position: Posterior Station: -2 Presentation: Vertex Exam by:: K.Maldonado,RN  - last exam at 0730  Membrane status: SROM at 0830 Fluid: Clear   Labs: Lab Results  Component Value Date   WBC 7.7 11/05/2018   HGB 10.6 (L) 11/05/2018   HCT 32.6 (L) 11/05/2018   MCV 78.6 (L) 11/05/2018   PLT 328 11/05/2018    Assessment / Plan: IOL due to cholestasis at 39+1wks   Labor: Progressing normally Preeclampsia:  labs stable Fetal Wellbeing:  Category I Pain Control:  IV pain meds and may have epidural when desired I/D:  GBS Pos, s/p 2 doses.  Anticipated MOD:  NSVD  Christeen DouglasBethany Deno Sida, MD MPH 11/06/2018, 8:50 AM       Patient ID: Angie Schmidt, female   DOB: 17-Jul-1986, 33 y.o.   MRN: [id number]

## 2018-11-06 NOTE — Progress Notes (Signed)
Patient's 30 day papers signed on 10/10/18.  Notified Dr. Dalbert Garnet. BTL to be rescheduled as an Outpatient prcedure in 2 weeks.

## 2018-11-06 NOTE — Progress Notes (Signed)
Labor Progress Note  [name] is a 33 y.o. [phone] at 4552w1d by ultrasound admitted for induction of labor due to cholestasis.  Subjective: itching and requested sleep and anti-itch medicine around 0030.  Objective: BP 116/69 (BP Location: Left Arm)   Pulse 80   Temp 98.1 F (36.7 C) (Oral)   Resp 16   Ht 5\' 3"  (1.6 m)   Wt 77.6 kg   LMP 01/10/2018 (Approximate)   BMI 30.29 kg/m  Notable VS details: reviewed  Fetal Assessment: FHT:  FHR: 135 bpm, variability: moderate,  accelerations:  Present,  decelerations:  Absent Category/reactivity:  Category I UC:   irregular, every 1-4 minutes, Pitocin at 6910mu/min SVE:   Dilation: 5 Effacement (%): 50 Cervical Position: Posterior Station: -2 Presentation: Vertex Exam by:: R. Robertine Kipper CNM  - last exam at 2330, fetal head not well applied, cook catheter out.  Membrane status:intact   Labs: Lab Results  Component Value Date   WBC 7.7 11/05/2018   HGB 10.6 (L) 11/05/2018   HCT 32.6 (L) 11/05/2018   MCV 78.6 (L) 11/05/2018   PLT 328 11/05/2018    Assessment / Plan: IOL due to cholestasis at 39+1wks - Atarax 50mg  PO for itching and sleep.  Labor: progressing s/p Cook cath and on pitocin now. fetal station not well applied, no AROM at this time.  Preeclampsia:  labs stable Fetal Wellbeing:  Category I Pain Control:  Labor support without medications I/D:  GBS Pos, s/p 2 doses.  Anticipated MOD:  NSVD  Angie Schmidt, CNM 11/06/2018, 2:28 AM

## 2018-11-07 ENCOUNTER — Encounter
Admission: EM | Disposition: A | Discharge status: Home / Self Care | Payer: Self-pay | Source: Home / Self Care | Attending: Obstetrics and Gynecology

## 2018-11-07 LAB — CBC
HCT: 27.9 % — ABNORMAL LOW (ref 36.0–46.0)
Hemoglobin: 8.8 g/dL — ABNORMAL LOW (ref 12.0–15.0)
MCH: 25.4 pg — ABNORMAL LOW (ref 26.0–34.0)
MCHC: 31.5 g/dL (ref 30.0–36.0)
MCV: 80.6 fL (ref 80.0–100.0)
NRBC: 0 % (ref 0.0–0.2)
Platelets: 296 10*3/uL (ref 150–400)
RBC: 3.46 MIL/uL — ABNORMAL LOW (ref 3.87–5.11)
RDW: 15.2 % (ref 11.5–15.5)
WBC: 11.4 10*3/uL — ABNORMAL HIGH (ref 4.0–10.5)

## 2018-11-07 SURGERY — LIGATION, FALLOPIAN TUBE, POSTPARTUM
Anesthesia: Choice | Laterality: Bilateral

## 2018-11-07 NOTE — Lactation Note (Addendum)
This note was copied from a baby's chart. Lactation Consultation Note  Patient Name: Angie Schmidt Date: 11/07/2018 Reason for consult: Initial assessment   Maternal Data    Feeding Feeding Type: Breast Fed(Attempted, infant not transferring milk)  LATCH Score Latch: Repeated attempts needed to sustain latch, nipple held in mouth throughout feeding, stimulation needed to elicit sucking reflex.  Audible Swallowing: None  Type of Nipple: Everted at rest and after stimulation  Comfort (Breast/Nipple): Engorged, cracked, bleeding, large blisters, severe discomfort  Hold (Positioning): Assistance needed to correctly position infant at breast and maintain latch.  LATCH Score: 4  Interventions Interventions: Breast feeding basics reviewed;Assisted with latch;Adjust position;Support pillows;Position options;Expressed milk;Coconut oil;DEBP  Lactation Tools Discussed/Used Pump Review: Setup, frequency, and cleaning Initiated by:: Angie Gandy, RN IBCLC   Consult Status Consult Status: Follow-up Date: 11/07/18 Follow-up type: In-patient  Mother states that she is experiencing pain while breastfeeding. Mother has bruising on both nipples. LC attempted to assist with latch and positioning as well as sandwiching the breast for a deeper latch. Mother states that each attempt was causing her pain. When infant latched, LC observed shorter sucks as if he was "pacifying" and no swallows were heard. Plan is for mother to initiate pumping to allow nipples to heal and to give pumped milk to infant via bottle. Mother was instructed on how to use the pump via manufacturer's instructions.  Angie Schmidt 11/07/2018, 6:27 PM

## 2018-11-07 NOTE — Progress Notes (Signed)
Post Partum Day 1, late entry from earlier in day. Subjective: Doing well, no complaints.  Tolerating regular diet, pain with PO meds, voiding and ambulating without difficulty.  No CP SOB F/C N/V or leg pain no HA change of vision, RUQ/epigastric pain  Objective: BP 126/81   Pulse 72   Temp 98.2 F (36.8 C) (Oral)   Resp 20   Ht 5\' 3"  (1.6 m)   Wt 77.6 kg   LMP 01/10/2018 (Approximate)   SpO2 99%   Breastfeeding Unknown   BMI 30.29 kg/m    Physical Exam:  General: NAD CV: RRR Pulm: nl effort, CTABL Lochia: moderate Uterine Fundus: fundus firm and below umbilicus DVT Evaluation: no cords, ttp LEs   Recent Labs    11/05/18 1731 11/07/18 0641  HGB 10.6* 8.8*  HCT 32.6* 27.9*  WBC 7.7 11.4*  PLT 328 296    Assessment/Plan: 32 y.o. [id number] postpartum day # 1  1. Acute blood loss anemia - on PO FeSO4, asymptomatic 2. Routine postpartum cares 3. Lactation support 4. Anticipate d/c tomorrow  ----- Ranae Plumber, MD Attending Obstetrician and Gynecologist [facility]

## 2018-11-08 MED ORDER — BISACODYL 10 MG RE SUPP: 10.0000 mg | Freq: Every day | RECTAL | 0 refills | Status: DC | PRN

## 2018-11-08 MED ORDER — PRENATAL MULTIVITAMIN CH: 1.0000 | ORAL_TABLET | Freq: Every day | ORAL | Status: DC

## 2018-11-08 MED ORDER — FERROUS SULFATE 325 (65 FE) MG PO TABS: 325.0000 mg | ORAL_TABLET | Freq: Two times a day (BID) | ORAL | 3 refills | Status: DC

## 2018-11-08 MED ORDER — ACETAMINOPHEN 325 MG PO TABS: 650.0000 mg | ORAL_TABLET | ORAL | Status: DC | PRN

## 2018-11-08 MED ORDER — IBUPROFEN 600 MG PO TABS: 600.0000 mg | ORAL_TABLET | Freq: Four times a day (QID) | ORAL | 0 refills | Status: DC

## 2018-11-08 MED ORDER — ZOLPIDEM TARTRATE 5 MG PO TABS: 5.0000 mg | ORAL_TABLET | Freq: Every evening | ORAL | 0 refills | Status: DC | PRN

## 2018-11-08 MED ORDER — SENNOSIDES-DOCUSATE SODIUM 8.6-50 MG PO TABS: 2.0000 | ORAL_TABLET | ORAL | Status: DC

## 2018-11-08 NOTE — Progress Notes (Signed)
Post Partum Day 2 Subjective: Doing well, no complaints.  Tolerating regular diet, pain with PO meds, voiding and ambulating without difficulty.  No CP SOB Fever,Chills, N/V or leg pain; denies nipple or breast pain; no HA change of vision, RUQ/epigastric pain  Objective: BP (!) 99/51   Pulse (!) 54   Temp 98.2 F (36.8 C) (Oral)   Resp 20   Ht 5\' 3"  (1.6 m)   Wt 77.6 kg   LMP 01/10/2018 (Approximate)   SpO2 98%   Breastfeeding Unknown   BMI 30.29 kg/m    Physical Exam:  General: NAD Breasts: soft/nontender CV: RRR Pulm: nl effort, CTABL Abdomen: soft, NT, BS x 4 Perineum: minimal edema, intact Lochia: small Uterine Fundus: fundus firm and 1 fb below umbilicus DVT Evaluation: no cords, ttp LEs   Recent Labs    11/05/18 1731 11/07/18 0641  HGB 10.6* 8.8*  HCT 32.6* 27.9*  WBC 7.7 11.4*  PLT 328 296    Assessment/Plan: 33 y.o. [id number] postpartum day # 2  - Continue routine PP care - Lactation consult prn.  - Discussed contraceptive options including implant, IUDs hormonal and non-hormonal, injection, pills/ring/patch, condoms, and NFP. Pt desires BTL interval, to have f/u 2 wk preop.  - Acute blood loss anemia - hemodynamically stable and asymptomatic; continue po ferrous sulfate BID with stool softeners  - Immunization status: offer Tdap and Flu prior to dc   Disposition: Does desire Dc home today.     Randa Ngo, CNM 11/08/2018  8:26 AM

## 2018-11-08 NOTE — Progress Notes (Signed)
Patient discharged home with infant. Discharge instructions and prescriptions given and reviewed with patient. Patient verbalized understanding. Escorted out by auxillary.  

## 2018-11-08 NOTE — Lactation Note (Signed)
This note was copied from a baby's chart. Lactation Consultation Note  Patient Name: Angie Schmidt [id number] Date: 11/08/2018 Reason for consult: Follow-up assessment I have contacted [facility] for a breast pump and gave mother the number to call [employer]IC to get a pump.   Feeding Feeding Type: Bottle Fed - Formula Nipple Type: Slow - flow  LATCH Score                   Interventions    Lactation Tools Discussed/Used     Consult Status      Angie Schmidt 11/08/2018, 11:47 AM

## 2018-12-06 ENCOUNTER — Inpatient Hospital Stay: Admission: RE | Admit: 2018-12-06 | Payer: Medicaid Other | Source: Ambulatory Visit

## 2018-12-07 ENCOUNTER — Inpatient Hospital Stay: Admission: RE | Admit: 2018-12-07 | Payer: Medicaid Other | Source: Ambulatory Visit

## 2018-12-10 ENCOUNTER — Other Ambulatory Visit: Payer: Self-pay

## 2018-12-10 ENCOUNTER — Encounter
Admission: RE | Admit: 2018-12-10 | Discharge: 2018-12-10 | Disposition: A | Discharge status: Home / Self Care | Payer: Medicaid Other | Source: Ambulatory Visit | Attending: Obstetrics and Gynecology | Admitting: Obstetrics and Gynecology

## 2018-12-10 HISTORY — DX: Major depressive disorder, single episode, unspecified: F32.9

## 2018-12-10 HISTORY — DX: Anemia, unspecified: D64.9

## 2018-12-10 HISTORY — DX: Anxiety disorder, unspecified: F41.9

## 2018-12-10 HISTORY — DX: Gastro-esophageal reflux disease without esophagitis: K21.9

## 2018-12-10 HISTORY — DX: Headache, unspecified: R51.9

## 2018-12-10 HISTORY — DX: Headache: R51

## 2018-12-10 HISTORY — DX: Depression, unspecified: F32.A

## 2018-12-10 HISTORY — DX: Unspecified convulsions: R56.9

## 2018-12-10 NOTE — Patient Instructions (Signed)
Your procedure is scheduled on: 12-14-18 FRIDAY Report to Same Day Surgery 2nd floor medical mall [facility] Entrance-take elevator on left to 2nd floor.  Check in with surgery information desk.) To find out your arrival time please call [phone] between 1PM - 3PM on 12-13-18 THURSDAY  Remember: Instructions that are not followed completely may result in serious medical risk, up to and including death, or upon the discretion of your surgeon and anesthesiologist your surgery may need to be rescheduled.    _x___ 1. Do not eat food after midnight the night before your procedure. NO GUM OR CANDY AFTER MIDNIGHT.  You may drink clear liquids up to 2 hours before you are scheduled to arrive at the hospital for your procedure.  Do not drink clear liquids within 2 hours of your scheduled arrival to the hospital.  Clear liquids include  --Water or Apple juice without pulp  --Clear carbohydrate beverage such as ClearFast or Gatorade  --Black Coffee or Clear Tea (No milk, no creamers, do not add anything to the coffee or Tea   ____Ensure clear carbohydrate drink on the way to the hospital for bariatric patients  ____Ensure clear carbohydrate drink 3 hours before surgery for Dr Rutherford Nail patients if physician instructed.    __x__ 2. No Alcohol for 24 hours before or after surgery.   __x__3. No Smoking or e-cigarettes for 24 prior to surgery.  Do not use any chewable tobacco products for at least 6 hour prior to surgery   ____  4. Bring all medications with you on the day of surgery if instructed.    __x__ 5. Notify your doctor if there is any change in your medical condition     (cold, fever, infections).    x___6. On the morning of surgery brush your teeth with toothpaste and water.  You may rinse your mouth with mouth wash if you wish.  Do not swallow any toothpaste or mouthwash.   Do not wear jewelry, make-up, hairpins, clips or nail polish.  Do not wear lotions, powders, or perfumes.  You may wear deodorant.  Do not shave 48 hours prior to surgery. Men may shave face and neck.  Do not bring valuables to the hospital.    [facility] is not responsible for any belongings or valuables.               Contacts, dentures or bridgework may not be worn into surgery.  Leave your suitcase in the car. After surgery it may be brought to your room.  For patients admitted to the hospital, discharge time is determined by your  treatment team.  _  Patients discharged the day of surgery will not be allowed to drive home.  You will need someone to drive you home and stay with you the night of your procedure.    Please read over the following fact sheets that you were given:   [facility] Preparing for Surgery    ____ Take anti-hypertensive listed below, cardiac, seizure, asthma, anti-reflux and psychiatric medicines. These include:  1. NONE  2.  3.  4.  5.  6.  ____Fleets enema or Magnesium Citrate as directed.   _x___ Use CHG Soap or sage wipes as directed on instruction sheet   ____ Use inhalers on the day of surgery and bring to hospital day of surgery  ____ Stop Metformin and Janumet 2 days prior to surgery.    ____ Take 1/2 of usual insulin dose the night before surgery and  none on the morning surgery.   ____ Follow recommendations from Cardiologist, Pulmonologist or PCP regarding stopping Aspirin, Coumadin, Plavix ,Eliquis, Effient, or Pradaxa, and Pletal.  X____Stop Anti-inflammatories such as Advil, Aleve, Ibuprofen, Motrin, Naproxen, Naprosyn, Goodies powders, EXCEDRIN or aspirin products NOW-OK to take Tylenol    ____ Stop supplements until after surgery.     ____ Bring C-Pap to the hospital.

## 2018-12-12 ENCOUNTER — Inpatient Hospital Stay: Admission: RE | Admit: 2018-12-12 | Payer: Medicaid Other | Source: Ambulatory Visit

## 2018-12-12 NOTE — H&P (Signed)
Angie Schmidt is a 33 y.o. female [phone] here for Pre Op Consulting   Preop Visit for Interval BTL The patient is postpartum from a vaginal delivery on 11/06/2018. She presents to discuss permanent sterilization.   Preop visit for sterilization, outside of the postpartum period.  The patient is interested in permanent sterilization. In her own words, "I'm scared of the surgery but also scared of the IUD or nexplanon options that are just as effective. I am not sure I want surgery but am certain I don't want more kids." Ultimately she decided to proceed with the Tubal Ligation, and she is aware that she may cancel at any time.  Past Medical History:  has a past medical history of Abnormal Pap smear of cervix, Anemia, History of seizures, and Obesity.  Past Surgical History:  has no past surgical history on file. Family History: family history includes Colon cancer in her mother; Deep vein thrombosis (DVT or abnormal blood clot formation) in her mother; Diabetes in her mother; High blood pressure (Hypertension) in her father and mother; Hyperlipidemia (Elevated cholesterol) in her father; Lupus in her mother. Social History:  reports that she has quit smoking. She has never used smokeless tobacco. She reports previous alcohol use. She reports previous drug use. OB/GYN History:          OB History    Gravida  5   Para  4   Term  4   Preterm      AB  1   Living  3     SAB      TAB  1   Ectopic      Molar      Multiple      Live Births  3         Allergies: has No Known Allergies. Medications:  Current Outpatient Medications:  .  ferrous sulfate 325 (65 FE) MG EC tablet, Take 1 tablet (325 mg total) by mouth daily with breakfast, Disp: 30 tablet, Rfl: 6 .  prenatal vit-iron fum-folic ac (PRENAVITE) tablet, Take 1 tablet by mouth once daily, Disp: 30 tablet, Rfl: 11  Review of Systems: No SOB, no palpitations or chest pain, no new lower extremity edema, no  nausea or vomiting or bowel or bladder complaints. See HPI for gyn specific ROS.   Exam:   BP 109/70   Pulse 84   Wt 70.3 kg (155 lb)   LMP  (LMP Unknown)   Breastfeeding Yes   BMI 30.27 kg/m   General: Patient is well-groomed, well-nourished, appears stated age in no acute distress  Impression:   The primary encounter diagnosis was Encounter for counseling regarding contraception. A diagnosis of Preop examination was also pertinent to this visit.  Plan:   1) Contraception -Patient came in and expressed her concern about surgery and how she is scared; other contraception options were discussed before she ultimately decided on still proceeding with the BTL. -A handout on contraception was given to the patient  2) Preop for BTL Patient desires surgical sterilization.  Patient has been counseled on alternate forms of contraception including hormonal forms, IUD's and barrier methods. She has been counseled on risks of surgical sterilization including bleeding, infection, pain, injury during procedure, risk of need for further procedures/surgeries due to injury or abnormalities at the time of surgery, thromboembolic events, exacerbation of ongoing medical conditions, risk of ectopic pregnancy, risk of failure of procedure to prevent pregnancy, medication reactions as well as the risk of anesthesia.  Patient  verbalizes understanding.   Consent form signed.  Preoperative and postoperative instructions provided. Written and verbal education provided.  No barriers to learning.  Return for Schedule Post op.Cecilie Kicks, MD

## 2018-12-14 ENCOUNTER — Ambulatory Visit
Admission: RE | Admit: 2018-12-14 | Payer: Medicaid Other | Source: Home / Self Care | Admitting: Obstetrics and Gynecology

## 2018-12-14 ENCOUNTER — Encounter: Admission: RE | Payer: Self-pay | Source: Home / Self Care

## 2018-12-14 SURGERY — LIGATION, FALLOPIAN TUBE, LAPAROSCOPIC
Anesthesia: Choice | Laterality: Bilateral

## 2019-07-17 IMAGING — US US OB COMP LESS 14 WK
1 series · 14 of 28 positions shown · non-contrast
Comparison: None.

CLINICAL DATA: Initial evaluation for acute lower abdominal pain,
early pregnancy.

EXAM:
OBSTETRIC <14 WK US AND TRANSVAGINAL OB US
TECHNIQUE: Both transabdominal and transvaginal ultrasound examinations were
performed for complete evaluation of the gestation as well as the
maternal uterus, adnexal regions, and pelvic cul-de-sac.
Transvaginal technique was performed to assess early pregnancy.

[Series 1: us ob comp less 14 wk · 0.19mm/px · 14 of 102 slices shown]
[im 4/102]
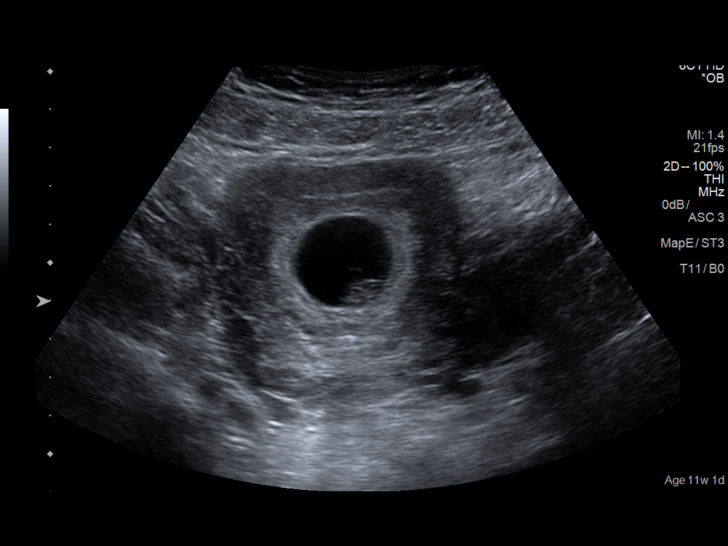
[im 12/102]
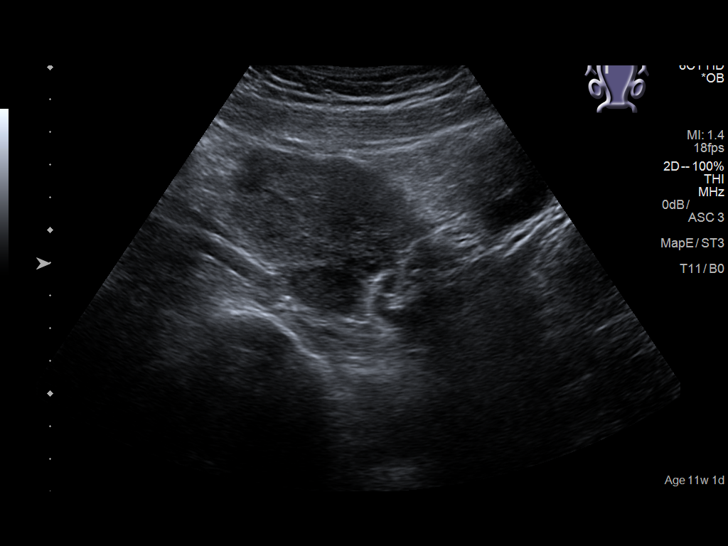
[im 19/102]
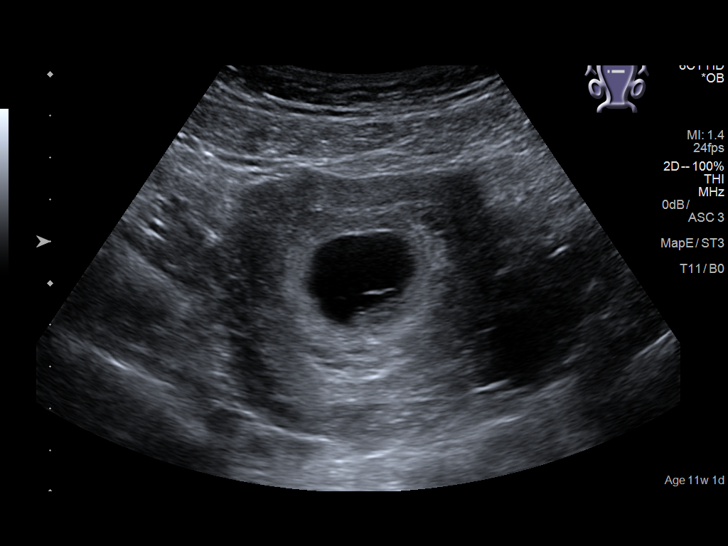
[im 27/102]
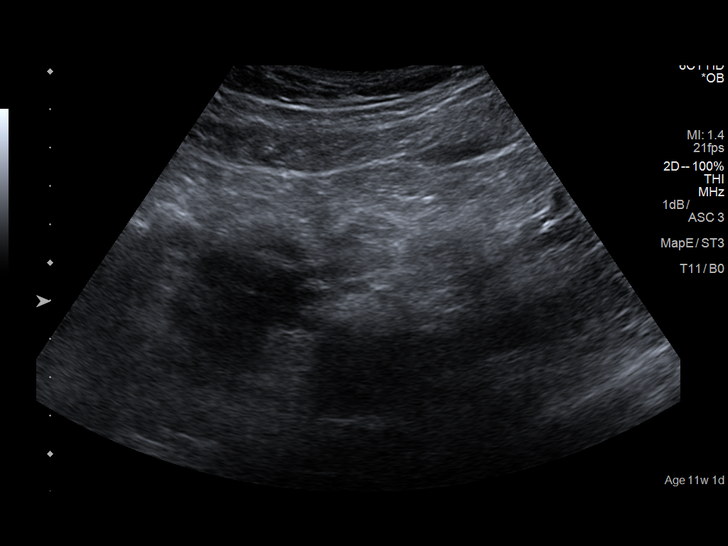
[im 34/102]
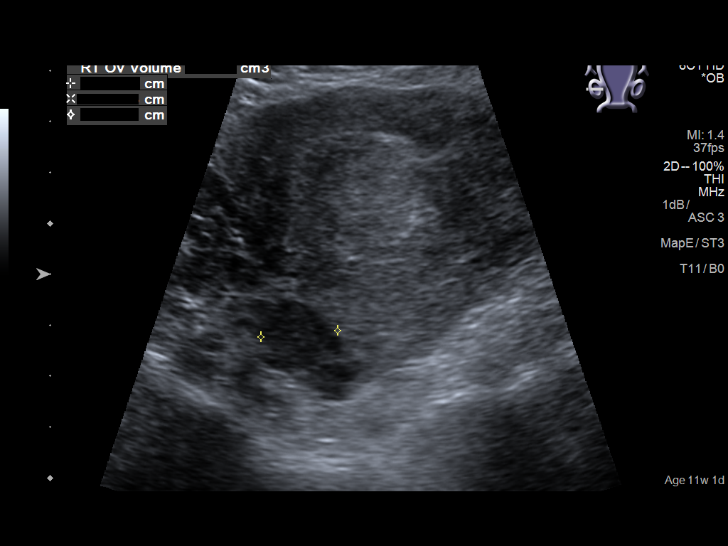
[im 42/102]
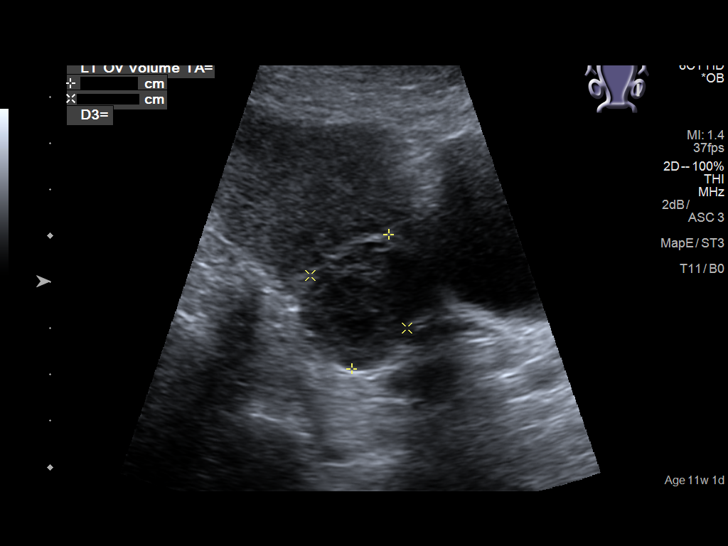
[im 49/102]
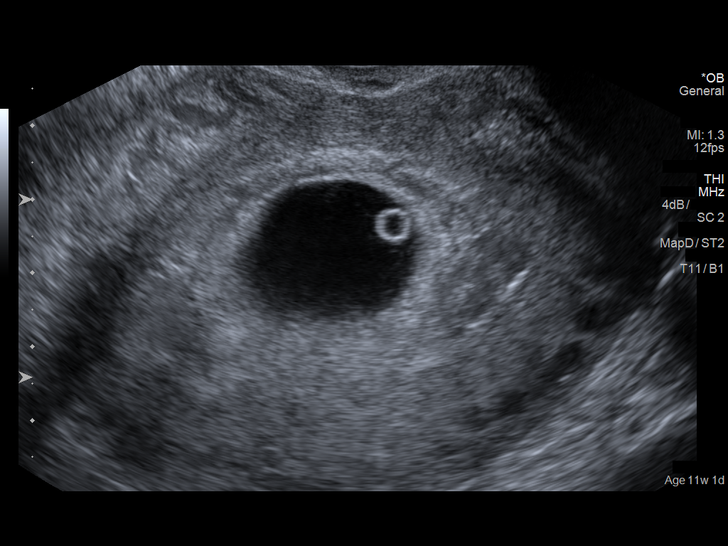
[im 57/102]
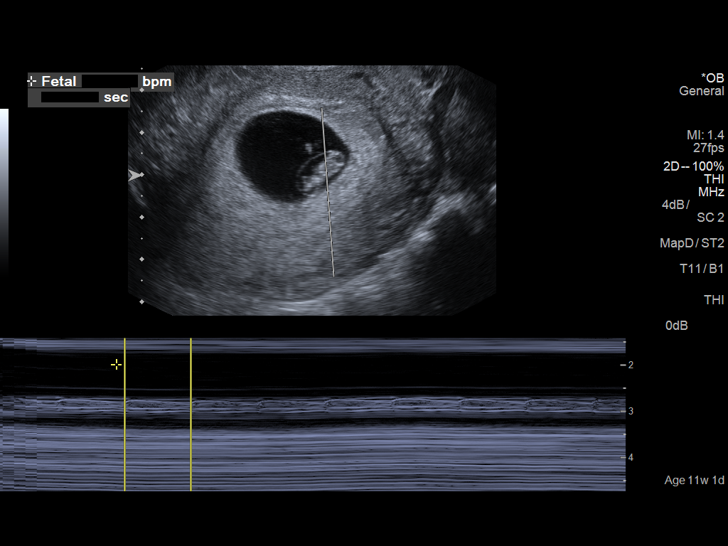
[im 64/102]
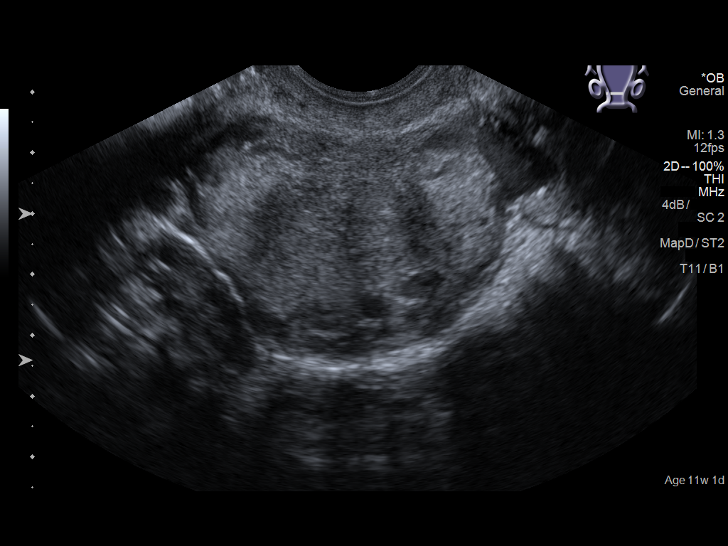
[im 72/102]
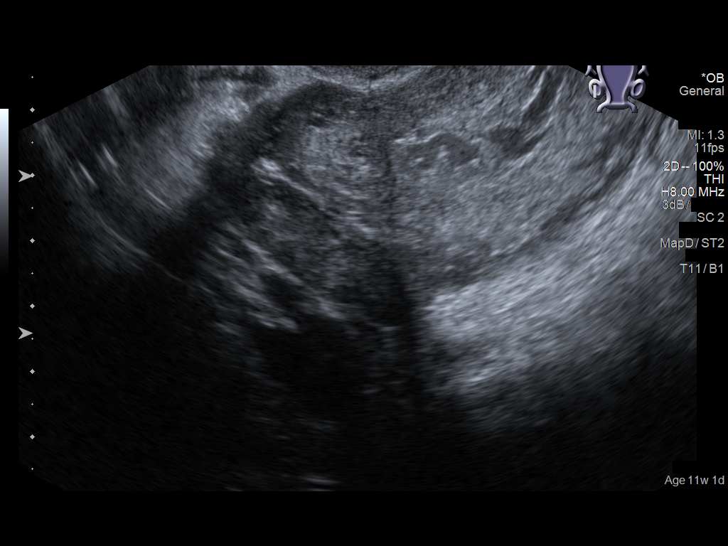
[im 79/102]
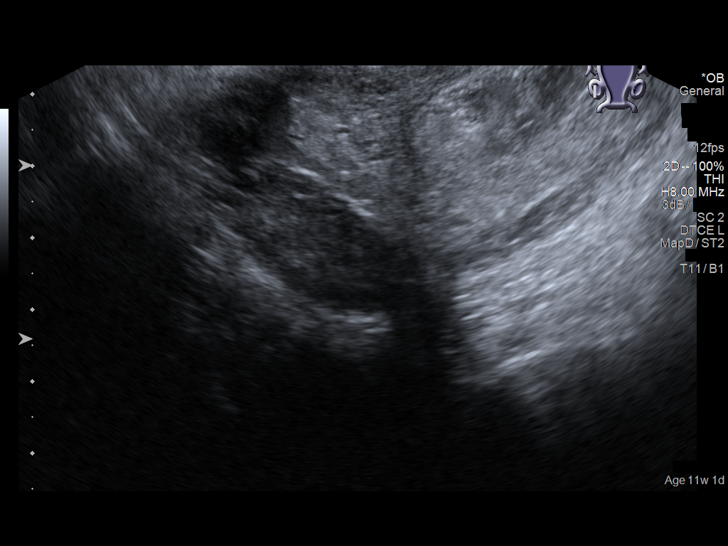
[im 87/102]
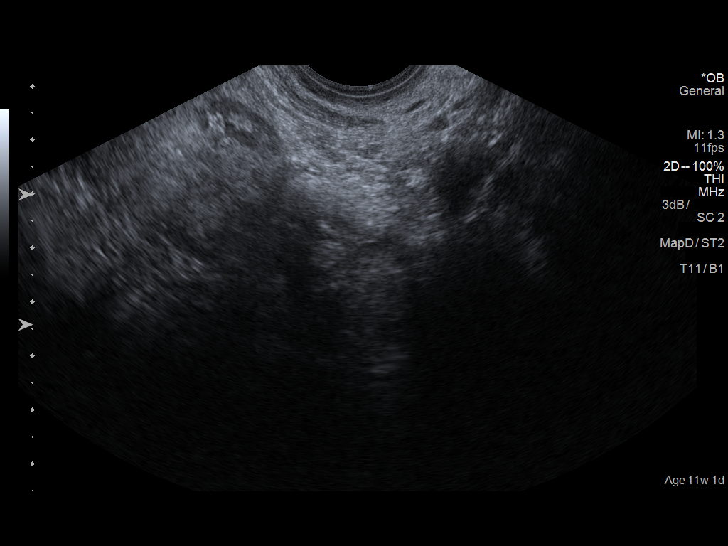
[im 94/102]
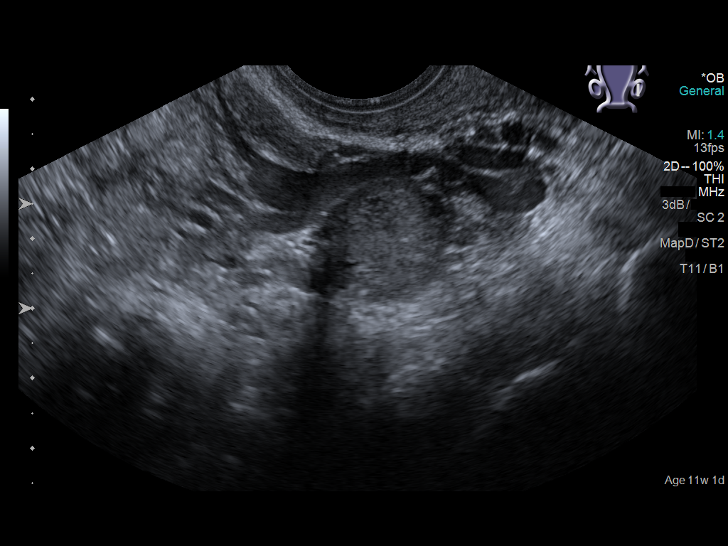
[im 102/102]
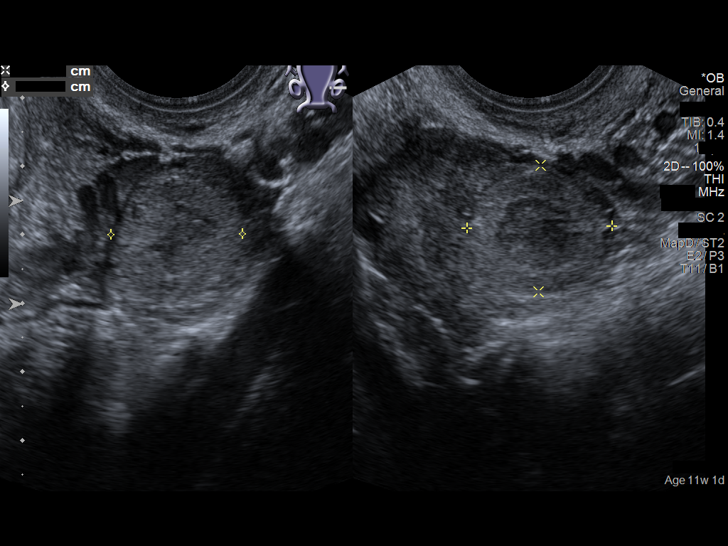

[14 of 28 positions shown; findings below may reference images not displayed]

FINDINGS: Intrauterine gestational sac: Single

Yolk sac:  Present

Embryo:  Present

Cardiac Activity: Present

Heart Rate: 155 bpm

CRL:  12.4 mm   7 w   3 d                  US EDC: 11/12/2018

Subchorionic hemorrhage:  None visualized.

Maternal uterus/adnexae: Ovaries are normal in appearance
bilaterally. Corpus luteal cyst noted on the left. No free fluid.
IMPRESSION: 1. Single viable intrauterine pregnancy without complication,
estimated gestational age 7 weeks and 3 days by crown-rump length.
2. No other acute maternal uterine or adnexal abnormality
identified.

## 2020-11-20 ENCOUNTER — Ambulatory Visit: Payer: Self-pay

## 2020-11-27 ENCOUNTER — Ambulatory Visit: Payer: Self-pay

## 2020-11-27 ENCOUNTER — Encounter: Payer: Self-pay | Admitting: Family Medicine

## 2021-04-26 ENCOUNTER — Ambulatory Visit (LOCAL_COMMUNITY_HEALTH_CENTER): Payer: Medicaid Other | Admitting: Advanced Practice Midwife

## 2021-04-26 ENCOUNTER — Other Ambulatory Visit: Payer: Self-pay

## 2021-04-26 ENCOUNTER — Encounter: Payer: Self-pay | Admitting: Advanced Practice Midwife

## 2021-04-26 VITALS — BP 108/73 | Ht 60.0 in | Wt 175.0 lb

## 2021-04-26 DIAGNOSIS — Z3202 Encounter for pregnancy test, result negative: Secondary | ICD-10-CM

## 2021-04-26 DIAGNOSIS — Z30011 Encounter for initial prescription of contraceptive pills: Secondary | ICD-10-CM

## 2021-04-26 DIAGNOSIS — R569 Unspecified convulsions: Secondary | ICD-10-CM | POA: Insufficient documentation

## 2021-04-26 DIAGNOSIS — Z3009 Encounter for other general counseling and advice on contraception: Secondary | ICD-10-CM | POA: Diagnosis not present

## 2021-04-26 DIAGNOSIS — E669 Obesity, unspecified: Secondary | ICD-10-CM | POA: Insufficient documentation

## 2021-04-26 DIAGNOSIS — F1290 Cannabis use, unspecified, uncomplicated: Secondary | ICD-10-CM | POA: Insufficient documentation

## 2021-04-26 LAB — PREGNANCY, URINE: Preg Test, Ur: NEGATIVE

## 2021-04-26 MED ORDER — NORETHINDRONE 0.35 MG PO TABS: 1.0000 | ORAL_TABLET | Freq: Every day | ORAL | 13 refills | Status: DC

## 2021-04-26 NOTE — Progress Notes (Signed)
Patient signed OC consent and counseled how to take properly. Patient counseled to call her pharmacy later tonight or tomorrow to see if prescription is ready. Patient here late and unable to have wet prep today, counseled to call for STD appointment if she desires testing for BV, yeast or trich. Patient states understanding.Burt Knack, RN

## 2021-04-26 NOTE — Progress Notes (Signed)
[facility] [address] Main Number: [phone]    Family Planning Visit- Initial Visit  Subjective:  Angie Schmidt is a 35 y.o. SHF [id number] (18, 16, 11, 2) exsmoker  being seen today for an initial annual visit and to discuss contraceptive options.  The patient is currently using None for pregnancy prevention. Patient reports she does not want a pregnancy in the next year.  Patient has the following medical conditions has Obesity BMI=34.1 and Seizures (HCC)--last seizure age 84 on their problem list.  Chief Complaint  Patient presents with   Annual Exam   Contraception    Wants OC, STD check, Pap    Patient reports LMP 04/18/21. Last sex 04/18/21 without condom; with current partner x 8 years; 1 partner in last 3 mo. Last PE 09/20/16. Last pap 04/12/18 neg. Last cig 4 years ago. Last MJ yesterday. Last ETOH 04/24/21 (2 beers) 2x/wk. Employed 20-22 hours/wk. Not in school. Living with her 67 yo cousin and her 3 kids.  Patient denies vaping, cigars  Body mass index is 34.18 kg/m. - Patient is eligible for diabetes screening based on BMI and age >58?  not applicable HA1C ordered? not applicable  Patient reports 1  partner/s in last year. Desires STI screening?  Lab is closed so unable to do  Has patient been screened once for HCV in the past?  No  No results found for: HCVAB  Does the patient have current drug use (including MJ), have a partner with drug use, and/or has been incarcerated since last result? Yes  If yes-- Screen for HCV through [facility]   Does the patient meet criteria for HBV testing? No  Criteria:  -Household, sexual or needle sharing contact with HBV -History of drug use -HIV positive -Those with known Hep C   Health Maintenance Due  Topic Date Due   COVID-19 Vaccine (1) Never done   HIV Screening  Never done   Hepatitis C Screening  Never done   PAP SMEAR-Modifier  Never done     Review of Systems  Genitourinary:  Positive for frequency (referred to primary care MD).  Neurological:  Positive for seizures (from childhood until age 21; never had neuro evaluation) and headaches (2x/wk bilateral temples relieved with IB, -N&V,-audio,+visual blurry).  Endo/Heme/Allergies:  Bruises/bleeds easily (referred to primary care MD for ease of bruising).  All other systems reviewed and are negative.  The following portions of the patient's history were reviewed and updated as appropriate: allergies, current medications, past family history, past medical history, past social history, past surgical history and problem list. Problem list updated.   See flowsheet for other program required questions.  Objective:   Vitals:   04/26/21 1630  BP: 108/73  Weight: 175 lb (79.4 kg)  Height: 5' (1.524 m)    Physical Exam Constitutional:      Appearance: Normal appearance. She is obese.  HENT:     Head: Normocephalic and atraumatic.     Mouth/Throat:     Mouth: Mucous membranes are moist.     Comments: Last dental exam 2022 Eyes:     Conjunctiva/sclera: Conjunctivae normal.  Neck:     Thyroid: No thyroid mass, thyromegaly or thyroid tenderness.  Cardiovascular:     Rate and Rhythm: Normal rate and regular rhythm.  Pulmonary:     Effort: Pulmonary effort is normal.     Breath sounds: Normal breath sounds.  Chest:  Breasts:  Right: Normal.     Left: Normal.  Abdominal:     Palpations: Abdomen is soft.     Comments: Soft without masses or tenderness, fair tone  Genitourinary:    General: Normal vulva.     Exam position: Lithotomy position.     Vagina: Vaginal discharge (white creamy leukorrhea, wet mount not done because after 5:00 and lab closed) present.     Cervix: Normal.     Uterus: Normal.      Adnexa: Right adnexa normal and left adnexa normal.     Rectum: Normal.  Musculoskeletal:        General: Normal range of motion.     Cervical back: Normal range  of motion and neck supple.  Skin:    General: Skin is warm and dry.  Neurological:     Mental Status: She is alert.  Psychiatric:        Mood and Affect: Mood normal.      Assessment and Plan:  Angie Schmidt is a 35 y.o. female presenting to the [facility] Department for an initial annual wellness/contraceptive visit  Contraception counseling: Reviewed all forms of birth control options in the tiered based approach. available including abstinence; over the counter/barrier methods; hormonal contraceptive medication including pill, patch, ring, injection,contraceptive implant, ECP; hormonal and nonhormonal IUDs; permanent sterilization options including vasectomy and the various tubal sterilization modalities. Risks, benefits, and typical effectiveness rates were reviewed.  Questions were answered.  Written information was also given to the patient to review.  Patient desires ocp's, this was prescribed for patient. She will follow up in  1 year for surveillance.  She was told to call with any further questions, or with any concerns about this method of contraception.  Emphasized use of condoms 100% of the time for STI prevention.  Patient was not offered. ECP was not accepted by the patient. ECP counseling was not given - see RN documentation  1. Family planning Pt needs to change her Medicaid assigned primary care MD (pediatrician) - HIV [place] [facility] - Syphilis Serology, [place] [facility] - Pregnancy, urine - Chlamydia/Gonorrhea [facility] - IGP, Aptima HPV - norethindrone (MICRONOR) 0.35 MG tablet; Take 1 tablet (0.35 mg total) by mouth daily.  Dispense: 28 tablet; Refill: 13  2. Encounter for initial prescription of contraceptive pills E-rx Micronor due to hx seizures without neuro workup and last seizure age 40  3. Obesity, unspecified classification, unspecified obesity type, unspecified whether serious comorbidity present   4. Seizures (HCC)--last seizure age 32 From  childhood until age 51; never had neuro workup     Return for yearly physical exam.  No future appointments.  Alberteen Spindle, CNM

## 2021-04-29 ENCOUNTER — Encounter: Payer: Self-pay | Admitting: Advanced Practice Midwife

## 2021-04-29 DIAGNOSIS — R87619 Unspecified abnormal cytological findings in specimens from cervix uteri: Secondary | ICD-10-CM | POA: Insufficient documentation

## 2021-04-29 LAB — IGP, APTIMA HPV
HPV Aptima: POSITIVE — AB
PAP Smear Comment: 0

## 2021-04-29 LAB — HM HIV SCREENING LAB: HM HIV Screening: NEGATIVE

## 2021-05-03 ENCOUNTER — Ambulatory Visit: Payer: Medicaid Other | Admitting: Physician Assistant

## 2021-05-03 ENCOUNTER — Other Ambulatory Visit: Payer: Self-pay

## 2021-05-03 DIAGNOSIS — Z113 Encounter for screening for infections with a predominantly sexual mode of transmission: Secondary | ICD-10-CM

## 2021-05-03 DIAGNOSIS — Z299 Encounter for prophylactic measures, unspecified: Secondary | ICD-10-CM

## 2021-05-04 LAB — WET PREP FOR TRICH, YEAST, CLUE
Trichomonas Exam: NEGATIVE
Yeast Exam: NEGATIVE

## 2021-05-04 MED ORDER — METRONIDAZOLE 500 MG PO TABS: 500.0000 mg | ORAL_TABLET | Freq: Two times a day (BID) | ORAL | 0 refills | Status: AC

## 2021-05-04 NOTE — Progress Notes (Signed)
S:  Patient states that she is here to "finish my visit from last week."  Reports that she did not get a wet mount done last week when she was here because it was after 5 pm and was told to come back this week to have the test done.  States that she has had increased amount of yellow discharge with odor for 1 month and irregular vaginal bleeding for about 2 months.  Patient also has questions about her pap results and what happens now.  Denies other symptoms and states that her other testing is negative.  Requests wet mount only.  NKDA. O:  WDWN female in NAD, A&O x 3, normal work of breathing.  Wet mount = all negative today. A/P:  1.  Patient requests to self collect sample for wet mount.  Counseled patient how to collect for accurate results. 2.  Reviewed with patient that wet mount is all negative. 3.  Counseled patient that due to symptoms that she described, I will offer to treat her to cover for BV and Trich to see if this clears her symptoms and helps with her bleeding. 4.  Metronidazole 500 mg #14 1 po BID for 7 days with food, no EtOH for 24 hr before and until 72 hr after completing medicine, accepted by patient. The patient was dispensed Metronidazole  today. I provided counseling today regarding the medication. We discussed the medication, the side effects and when to call clinic. Patient given the opportunity to ask questions. Questions answered.   5.  No sex for 10 days. 6.  RTC if symptoms persist or worsen for further evaluation. 7.  Enc to use OTC antifungal cream if has itching during or after antibiotic use. 8.  RTC prn.

## 2021-05-04 NOTE — Progress Notes (Signed)
Chart reviewed by Pharmacist  Suzanne Walker PharmD, Contract Pharmacist at [facility]  

## 2021-05-27 ENCOUNTER — Other Ambulatory Visit: Payer: Self-pay

## 2021-05-27 ENCOUNTER — Ambulatory Visit
Admission: EM | Admit: 2021-05-27 | Discharge: 2021-05-27 | Disposition: A | Discharge status: Home / Self Care | Payer: Medicaid Other | Attending: Family | Admitting: Family

## 2021-05-27 ENCOUNTER — Encounter: Payer: Self-pay | Admitting: Emergency Medicine

## 2021-05-27 DIAGNOSIS — S39012A Strain of muscle, fascia and tendon of lower back, initial encounter: Secondary | ICD-10-CM

## 2021-05-27 DIAGNOSIS — M5441 Lumbago with sciatica, right side: Secondary | ICD-10-CM | POA: Diagnosis not present

## 2021-05-27 DIAGNOSIS — R42 Dizziness and giddiness: Secondary | ICD-10-CM

## 2021-05-27 MED ORDER — METAXALONE 800 MG PO TABS: 800.0000 mg | ORAL_TABLET | Freq: Three times a day (TID) | ORAL | 0 refills | Status: DC | PRN

## 2021-05-27 MED ORDER — NAPROXEN 500 MG PO TBEC: 500.0000 mg | DELAYED_RELEASE_TABLET | Freq: Two times a day (BID) | ORAL | 0 refills | Status: DC | PRN

## 2021-05-27 NOTE — ED Triage Notes (Signed)
Pt presents today with c/o of right lower back pain on 05/25/21, denies injury. She also c/o of dizzines "when looking up".

## 2021-05-27 NOTE — ED Provider Notes (Signed)
[facility]    CSN: [id number] Arrival date & time: 05/27/21  1159      History   Chief Complaint Chief Complaint  Patient presents with   Back Pain   Dizziness    HPI Angie Schmidt is a 35 y.o. female.   35 year old female presents with right lower back pain that started 2 days ago. 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 She rested yesterday with minimal movement and activity and the pain improved. Pain will radiate into her right buttock. No radiation down her right leg or upper back.  No numbness.  No chest pain, abdominal pain or shortness of breath.  Denies any dysuria or blood in her urine.  She has tried ibuprofen and Tylenol with some relief.  Also having some lightheaded and dizziness mainly when stretching her neck and looking up with her eyes.  Some nausea and slight headache but no vomiting.  Had similar dizziness in the past when she needed glasses.  Has been to [facility] in the past with good success.  Went to another eye doctor more recently and the correction for her vision was not right but did not go back to follow-up.  Not currently wearing glasses.  Has history of anemia, GERD, headaches, and recent abnormal Pap smear.  Also has history of seizure disorder but last seizure over 8 years ago.  Only current medication is Micronor, although she has not started this medication yet.  The history is provided by the patient.   Past Medical History:  Diagnosis Date   Anemia    Anxiety    NO MEDS   Cholestasis during pregnancy in third trimester 11/05/2018   GERD (gastroesophageal reflux disease)    NO MEDS   Headache    FREQUENT HA'S   Seizure (HCC)    LAST 2017   Vaginal Pap smear, abnormal     Patient Active Problem List   Diagnosis Date Noted   Abnormal Pap smear of cervix 04/26/21 ASCUS HPV+ 04/29/2021   Obesity BMI=34.1 04/26/2021   Seizures (HCC)--last seizure  age 14 04/26/2021   Marijuana use 04/26/2021    Past Surgical History:  Procedure Laterality Date   denies surgical history     NO PAST SURGERIES      OB History     Gravida  8   Para  4   Term  4   Preterm      AB  4   Living  4      SAB  2   IAB  2   Ectopic  0   Multiple  0   Live Births  1            Home Medications    Prior to Admission medications   Medication Sig Start Date End Date Taking? Authorizing Provider  metaxalone (SKELAXIN) 800 MG tablet Take 1 tablet (800 mg total) by mouth 3 (three) times daily as needed for muscle spasms. 05/27/21  Yes Sohil Timko, Ali Lowe, NP  naproxen (EC NAPROSYN) 500 MG EC tablet Take 1 tablet (500 mg total) by mouth every 12 (twelve) hours as needed (for pain). 05/27/21  Yes Babe Anthis, Ali Lowe, NP  norethindrone (MICRONOR) 0.35 MG tablet Take 1 tablet (0.35 mg total) by mouth daily. 04/26/21   Alberteen Spindle, CNM    Family History Family History  Problem Relation Age of Onset  Diabetes Paternal Grandfather    Diabetes Paternal Grandmother    Cancer Maternal Grandmother    Diabetes Maternal Grandmother    Lung disease Maternal Grandmother    Diabetes Maternal Grandfather    Hypertension Maternal Grandfather    Diabetes Father    Cancer Mother    Diabetes Mother    Hypertension Mother    Seizures Mother    Liver disease Mother    Clotting disorder Mother     Social History Social History   Tobacco Use   Smoking status: Former    Packs/day: 0.25    Years: 17.00    Pack years: 4.25    Types: Cigarettes    Quit date: 09/10/2017    Years since quitting: 3.7    Passive exposure: Never   Smokeless tobacco: Never  Vaping Use   Vaping Use: Never used  Substance Use Topics   Alcohol use: Yes    Alcohol/week: 3.0 standard drinks    Types: 3 Glasses of wine per week    Comment: last use 04/24/21 2x/wk   Drug use: Yes    Types: Marijuana    Comment: last use 04/25/21     Allergies   Patient has no  known allergies.   Review of Systems Review of Systems  Constitutional:  Negative for activity change, appetite change, chills, fatigue and fever.  HENT:  Negative for congestion, ear discharge, ear pain, facial swelling, hearing loss, rhinorrhea, sinus pressure, sinus pain, sore throat, tinnitus and trouble swallowing.   Eyes:  Positive for visual disturbance. Negative for photophobia, pain, discharge, redness and itching.  Respiratory:  Negative for chest tightness and shortness of breath.   Gastrointestinal:  Positive for nausea. Negative for abdominal pain and vomiting.  Genitourinary:  Negative for decreased urine volume, difficulty urinating, dysuria, flank pain, frequency and hematuria.  Musculoskeletal:  Positive for back pain. Negative for arthralgias, gait problem, myalgias, neck pain and neck stiffness.  Skin:  Negative for color change and rash.  Allergic/Immunologic: Negative for environmental allergies, food allergies and immunocompromised state.  Neurological:  Positive for dizziness, seizures (in past- none recently), light-headedness and headaches. Negative for tremors, syncope, facial asymmetry, speech difficulty, weakness and numbness.  Hematological:  Negative for adenopathy. Does not bruise/bleed easily.    Physical Exam Triage Vital Signs ED Triage Vitals  Enc Vitals Group     BP 05/27/21 1246 107/65     Pulse Rate 05/27/21 1246 70     Resp 05/27/21 1246 18     Temp 05/27/21 1246 98.2 F (36.8 C)     Temp Source 05/27/21 1246 Oral     SpO2 05/27/21 1246 100 %     Weight --      Height --      Head Circumference --      Peak Flow --      Pain Score 05/27/21 1243 6     Pain Loc --      Pain Edu? --      Excl. in GC? --    No data found.  Updated Vital Signs BP 107/65 (BP Location: Left Arm)   Pulse 70   Temp 98.2 F (36.8 C) (Oral)   Resp 18   LMP 05/16/2021 (Exact Date)   SpO2 100%   Visual Acuity Right Eye Distance:   Left Eye Distance:    Bilateral Distance:    Right Eye Near:   Left Eye Near:    Bilateral Near:     Physical Exam  Vitals and nursing note reviewed.  Constitutional:      General: She is awake. She is not in acute distress.    Appearance: She is well-developed and well-groomed. She is not ill-appearing.     Comments: She is sitting in the exam chair in no acute distress but changes positions slowly due to dizziness and back pain.   HENT:     Head: Normocephalic and atraumatic.     Right Ear: Hearing, tympanic membrane, ear canal and external ear normal.     Left Ear: Hearing, tympanic membrane, ear canal and external ear normal.     Nose: Nose normal.     Right Sinus: No maxillary sinus tenderness or frontal sinus tenderness.     Left Sinus: No maxillary sinus tenderness or frontal sinus tenderness.     Mouth/Throat:     Lips: Pink.     Mouth: Mucous membranes are moist.     Pharynx: Oropharynx is clear.  Eyes:     General: Lids are normal. Vision grossly intact. No visual field deficit.    Extraocular Movements: Extraocular movements intact.     Conjunctiva/sclera: Conjunctivae normal.     Pupils: Pupils are equal, round, and reactive to light.  Cardiovascular:     Rate and Rhythm: Normal rate and regular rhythm.  Pulmonary:     Effort: Pulmonary effort is normal.     Breath sounds: Normal breath sounds.  Musculoskeletal:        General: Tenderness present.     Cervical back: Normal, normal range of motion and neck supple.     Thoracic back: Tenderness present.     Lumbar back: Spasms and tenderness present. No swelling or edema. Decreased range of motion. Positive right straight leg raise test.       Back:     Comments: Decreased range of motion of lumbar back, mainly with flexion and rotation. Tender along right lower lumbar area with occasional muscle spasms detected. Slight tenderness of lower thoracic region. No swelling, redness or rash present. No neuro deficits noted. Good distal pulses.    Skin:    General: Skin is warm and dry.     Capillary Refill: Capillary refill takes less than 2 seconds.     Findings: No rash.  Neurological:     General: No focal deficit present.     Mental Status: She is alert and oriented to person, place, and time.     Cranial Nerves: Cranial nerves are intact.     Sensory: Sensation is intact. No sensory deficit.     Motor: Motor function is intact.     Gait: Gait is intact.  Psychiatric:        Mood and Affect: Mood normal.        Behavior: Behavior normal. Behavior is cooperative.        Thought Content: Thought content normal.        Judgment: Judgment normal.     UC Treatments / Results  Labs (all labs ordered are listed, but only abnormal results are displayed) Labs Reviewed - No data to display  EKG   Radiology No results found.  Procedures Procedures (including critical care time)  Medications Ordered in UC Medications - No data to display  Initial Impression / Assessment and Plan / UC Course  I have reviewed the triage vital signs and the nursing notes.  Pertinent labs & imaging results that were available during my care of the patient were reviewed by me and considered  in my medical decision making (see chart for details).     Reviewed with patient that she appears to have a right lower lumbar muscle strain with mild minimal right sciatica.  Do not feel imaging or other labs are needed at this time.  Recommend start naproxen 500 mg twice a day as needed for pain and inflammation.  Patient indicates that Flexeril was not helpful in the past.  May trial Skelaxin 800 mg every 8 hours as needed for muscle spasm/pain.  Change positions slowly due to pain and dizziness.  May apply warm compresses to area for comfort.  Discussed that dizziness may be related to change in vision. Doubt cardiac origin. Continue to monitor.  Recommend call [facility] today to schedule appointment for recheck of vision and more extensive  eye exam.  Note written for work.  If back pain worsens or dizziness does not improve, worsens, or any syncope occurs, go to the ER ASAP.  Otherwise follow-up here for back pain in 3 to 4 days if not improving  and with [facility] as recommended. Final Clinical Impressions(s) / UC Diagnoses   Final diagnoses:  Strain of lumbar region, initial encounter  Acute right-sided low back pain with right-sided sciatica  Dizziness     Discharge Instructions      Recommend start Naproxen 500mg  twice a day as needed for pain and inflammation. May take Skelaxin muscle relaxer 800mg  every 8 hours as needed for muscle spasms. Change positions slowly due to pain and dizziness. May apply warm compresses to area for comfort. Recommend contact [facility] for recheck of vision and more extensive eye exam. Follow-up here in 3 to 4 days if not improving or go to the ER ASAP if pain worsens and dizziness worsens or any passing out occurs.      ED Prescriptions     Medication Sig Dispense Auth. Provider   naproxen (EC NAPROSYN) 500 MG EC tablet Take 1 tablet (500 mg total) by mouth every 12 (twelve) hours as needed (for pain). 20 tablet , NP   metaxalone (SKELAXIN) 800 MG tablet Take 1 tablet (800 mg total) by mouth 3 (three) times daily as needed for muscle spasms. 21 tablet Arneta Mahmood, [facility], NP      PDMP not reviewed this encounter.   Sudie Grumbling, NP 05/28/21 1256

## 2021-05-27 NOTE — Discharge Instructions (Addendum)
Recommend start Naproxen 500mg  twice a day as needed for pain and inflammation. May take Skelaxin muscle relaxer 800mg  every 8 hours as needed for muscle spasms. Change positions slowly due to pain and dizziness. May apply warm compresses to area for comfort. Recommend contact [facility] for recheck of vision and more extensive eye exam. Follow-up here in 3 to 4 days if not improving or go to the ER ASAP if pain worsens and dizziness worsens or any passing out occurs.

## 2021-07-08 ENCOUNTER — Telehealth: Payer: Self-pay | Admitting: Family Medicine

## 2021-07-08 NOTE — Telephone Encounter (Signed)
Patient was told from last visit that she was going to be referred to [facility] and so she has not got any information or call from them so she called [facility] and was told that they never got a referral.

## 2021-08-12 ENCOUNTER — Other Ambulatory Visit: Payer: Self-pay | Admitting: Adult Health

## 2021-08-12 DIAGNOSIS — N946 Dysmenorrhea, unspecified: Secondary | ICD-10-CM

## 2021-08-19 ENCOUNTER — Telehealth: Payer: Self-pay

## 2021-08-19 ENCOUNTER — Ambulatory Visit
Admission: RE | Admit: 2021-08-19 | Discharge: 2021-08-19 | Disposition: A | Discharge status: Home / Self Care | Payer: Medicaid Other | Source: Ambulatory Visit | Attending: Adult Health | Admitting: Adult Health

## 2021-08-19 ENCOUNTER — Ambulatory Visit: Admission: RE | Admit: 2021-08-19 | Payer: Medicaid Other | Source: Ambulatory Visit

## 2021-08-19 DIAGNOSIS — N946 Dysmenorrhea, unspecified: Secondary | ICD-10-CM | POA: Diagnosis present

## 2021-08-19 NOTE — Telephone Encounter (Signed)
Telephone call to [facility] today to inquire about Colpo referral.  Spoke with Adelina Mings and she reports no referral from [facility] at this time.  I explained to her that the first referral was made on 05/06/2021 and we were asked to re-fax the referral on 07/09/2021 which I did and both times we had a confirmation and now this would make the 3rd attempt.  Referral was re-faxed today with all confirmations included.  I asked her to please be on the lookout for this referral and another new referral I just faxed earlier today. Remus Loffler, clinical supervisor notified today of the problem via text to hopefully eliminate delay in future Colpo referrals and appointments for patients for continuity of care. Hart Carwin, RN

## 2021-08-25 ENCOUNTER — Telehealth: Payer: Self-pay

## 2021-08-25 NOTE — Telephone Encounter (Signed)
Return call from Zerita Boers, Department Supervisor at [facility].  Left a voicemail on 08/19/2021. Hart Carwin, RN    Telephone call to Zerita Boers this morning.  Left message to return my call at [phone] regarding the Colpo referral. Hart Carwin, RN   Return call by Zerita Boers, Department Supervisor at [facility].  She is not sure exactly what happened with the referral process regarding the particular patient.  However, this particular patient has 2 accounts in their system with 2 different addresses.  One attempt had been made in August to contact patient without success due to non-working number.  The contact number for patient is the same as ours and we have not had any problems reaching the patient.   Tresa Endo will be checking on the process to see what else might have happened to delay the patient getting a Colpo sooner.  They will be attempting to reach the patient again today and if no success, she will call me back.  Tresa Endo is to check and see how the staff can notify me via fax, or email or other means to let me know the patient has been scheduled for their Colpo once the referral has been received.  Pending a phone call back with the plan going forward. Hart Carwin, RN.

## 2021-08-25 NOTE — Progress Notes (Signed)
08-25-2021 Received request from [facility] requesting a copy of medical records on patient Angie Schmidt, [birth year]b-Nov 06, 1985 for date of service 04-26-2021. The purpose of the medical record request if for validating coding practices, payment, accuracy, and adherence to [employer] payment policies, utilization standards and provider contract requirements.  Copy of medical record prepared and given to [facility] Finance Lead. Once [facility] finance and billing information added, copy of medical records will be mailed to: [facility], [place] [address] [address], [place], [place] [address]. Herby Abraham RN

## 2021-09-01 ENCOUNTER — Telehealth: Payer: Self-pay

## 2021-09-01 NOTE — Telephone Encounter (Signed)
Telephone call to patient regarding her Colpo referral.  I inquired with patient if [facility] had reached out to her about an appointment and she reports no.  I asked if she would be willing to go to another local office or if she would want to go to [facility] (I saw prior documentation of visits at [facility] in [place]).  Patient is ok with staying local.  She reports having Medicaid "[place]".  I told the patient I was very sorry about the long delay that normally it is a few weeks not months.  She was appreciative of all the help and follow up trying to get her seen.  I told her I would call some local offices and get her an appointment and call her back, hopefully by Monday. Hart Carwin, RN

## 2021-09-01 NOTE — Telephone Encounter (Signed)
Telephone call back to [facility] today.  Spoke with Angie Schmidt to inquire about Colpo referral.  Patient has not been scheduled for her Colpo at this time. No return call from Supervisor to clarify how we can be certain our referrals are scheduled once the referrals are faxed to their facility. Hart Carwin, RN

## 2021-09-03 ENCOUNTER — Telehealth: Payer: Self-pay

## 2021-09-03 NOTE — Telephone Encounter (Signed)
Telephone call to [facility] today regarding [facility] referral.  Spoke with Lelon Mast.  Appointment made for 09/28/2021 at 10 am (arrival time 9:45 am).  Referral records faxed today with confirmation. Hart Carwin, RN   Telephone call to patient cell number and voicemail has not been set up.  Home number was busy.  MyChart message sent to patient today with appointment information.  Last patient login was 08/21/2021. Hart Carwin, RN

## 2021-09-06 ENCOUNTER — Telehealth: Payer: Self-pay

## 2021-09-06 NOTE — Telephone Encounter (Signed)
Spoke to pateint today about her [facility] referral to [facility] on 09/28/21 at 10 am with Dr. Valentino Saxon. Patient reports [facility] finally called her on Friday and scheduled a [facility] for Monday 09/13/21 at 2:45 pm.  Pateint plans to keep the [facility] appointment, but if it does not go as planned, she will keep the [facility] appointment.  Patient is to call me after her [facility] appointment with [facility] on 09/13/21 with an update. Hart Carwin, RN

## 2021-09-28 ENCOUNTER — Encounter: Payer: Self-pay | Admitting: Obstetrics and Gynecology

## 2021-10-18 DIAGNOSIS — H52223 Regular astigmatism, bilateral: Secondary | ICD-10-CM | POA: Diagnosis not present

## 2021-10-19 DIAGNOSIS — H5213 Myopia, bilateral: Secondary | ICD-10-CM | POA: Diagnosis not present

## 2021-12-17 DIAGNOSIS — H52223 Regular astigmatism, bilateral: Secondary | ICD-10-CM | POA: Diagnosis not present

## 2022-01-25 ENCOUNTER — Ambulatory Visit: Payer: Medicaid Other

## 2022-05-26 DIAGNOSIS — O200 Threatened abortion: Secondary | ICD-10-CM | POA: Diagnosis not present

## 2022-06-22 DIAGNOSIS — D649 Anemia, unspecified: Secondary | ICD-10-CM | POA: Diagnosis not present

## 2022-06-22 DIAGNOSIS — E7800 Pure hypercholesterolemia, unspecified: Secondary | ICD-10-CM | POA: Diagnosis not present

## 2022-06-22 DIAGNOSIS — R946 Abnormal results of thyroid function studies: Secondary | ICD-10-CM | POA: Diagnosis not present

## 2022-06-22 DIAGNOSIS — D519 Vitamin B12 deficiency anemia, unspecified: Secondary | ICD-10-CM | POA: Diagnosis not present

## 2022-06-22 DIAGNOSIS — E559 Vitamin D deficiency, unspecified: Secondary | ICD-10-CM | POA: Diagnosis not present

## 2022-07-04 ENCOUNTER — Inpatient Hospital Stay: Payer: Medicaid Other

## 2022-07-04 ENCOUNTER — Inpatient Hospital Stay: Payer: Medicaid Other | Attending: Internal Medicine | Admitting: Internal Medicine

## 2022-07-04 ENCOUNTER — Encounter: Payer: Self-pay | Admitting: Internal Medicine

## 2022-07-04 VITALS — BP 115/77 | HR 64 | Temp 98.2°F | Resp 18 | Wt 168.4 lb

## 2022-07-04 DIAGNOSIS — D500 Iron deficiency anemia secondary to blood loss (chronic): Secondary | ICD-10-CM

## 2022-07-04 DIAGNOSIS — N920 Excessive and frequent menstruation with regular cycle: Secondary | ICD-10-CM | POA: Diagnosis not present

## 2022-07-04 DIAGNOSIS — Z87891 Personal history of nicotine dependence: Secondary | ICD-10-CM | POA: Insufficient documentation

## 2022-07-04 DIAGNOSIS — D509 Iron deficiency anemia, unspecified: Secondary | ICD-10-CM | POA: Diagnosis not present

## 2022-07-04 DIAGNOSIS — Z803 Family history of malignant neoplasm of breast: Secondary | ICD-10-CM | POA: Insufficient documentation

## 2022-07-04 DIAGNOSIS — D649 Anemia, unspecified: Secondary | ICD-10-CM

## 2022-07-04 NOTE — Progress Notes (Addendum)
[place]  Telephone:(336) [phone] Fax:(336) [phone]  ID: Angie Schmidt OB: 02/15/1986  MR#: [id number]  NAT#:[id number]  Patient Care Team: Patient, No Pcp Per as PCP - General (General Practice)  REFERRING PROVIDER: Eda Paschal, NP  REASON FOR REFERRAL: anemia  HPI: Angie Schmidt is a 36 y.o. female with past medical history of anemia, GERD was referred to hematology for further management of iron deficiency anemia.  Patient reports fatigue. She has lack of concentration. Has occasional dizziness on standing up. Has occasional chest pain which is chronic. She has long standing history of anemia since menarche. She has heavy menstrual periods. It lasts 1 week, uses tampon and pads and have to change every hour on initial few days. Per patient, she was seen by OB and has tried multiple birth control pills with no improvement in vaginal bleeding. It is hard for her to remember taking iron pills. Takes once a week. Her last pregnancy was 3 years ago. She has 4 children. Denies any gastric bypass surgery. Denies bleeding in stools, urine, nose or gum.  She has multiple stressors in her life. Her mother recently had surgery for breast cancer. Her brother passed away at age 72 from liver issues from alcohol use.   Labs reviewed from 06/22/2022.  Iron 33, TIBC 419, saturation 8%.  TSH normal.  WBC 8.8, hemoglobin 12, platelet 454. vitamin B12 232, folate 14.6.  REVIEW OF SYSTEMS:   ROS  As per HPI. Otherwise, a complete review of systems is negative.  PAST MEDICAL HISTORY: Past Medical History:  Diagnosis Date   Anemia    Anxiety    NO MEDS   Cholestasis during pregnancy in third trimester 11/05/2018   GERD (gastroesophageal reflux disease)    NO MEDS   Headache    FREQUENT HA'S   Seizure ([place])    LAST 2017   Vaginal Pap smear, abnormal     PAST SURGICAL HISTORY: Past Surgical History:  Procedure Laterality Date   denies surgical history     NO  PAST SURGERIES      FAMILY HISTORY: Family History  Problem Relation Age of Onset   Diabetes Paternal Grandfather    Diabetes Paternal Grandmother    Cancer Maternal Grandmother    Diabetes Maternal Grandmother    Lung disease Maternal Grandmother    Diabetes Maternal Grandfather    Hypertension Maternal Grandfather    Diabetes Father    Cancer Mother    Diabetes Mother    Hypertension Mother    Seizures Mother    Liver disease Mother    Clotting disorder Mother     HEALTH MAINTENANCE: Social History   Tobacco Use   Smoking status: Former    Packs/day: 0.25    Years: 17.00    Total pack years: 4.25    Types: Cigarettes    Quit date: 09/10/2017    Years since quitting: 4.8    Passive exposure: Never   Smokeless tobacco: Never  Vaping Use   Vaping Use: Never used  Substance Use Topics   Alcohol use: Yes    Alcohol/week: 3.0 standard drinks of alcohol    Types: 3 Glasses of wine per week    Comment: last use 04/24/21 2x/wk   Drug use: Yes    Types: Marijuana    Comment: last use 04/25/21     No Known Allergies  Current Outpatient Medications  Medication Sig Dispense Refill   metaxalone (SKELAXIN) 800 MG tablet Take 1 tablet (800  mg total) by mouth 3 (three) times daily as needed for muscle spasms. 21 tablet 0   naproxen (EC NAPROSYN) 500 MG EC tablet Take 1 tablet (500 mg total) by mouth every 12 (twelve) hours as needed (for pain). 20 tablet 0   norethindrone (MICRONOR) 0.35 MG tablet Take 1 tablet (0.35 mg total) by mouth daily. 28 tablet 13   No current facility-administered medications for this visit.    OBJECTIVE: There were no vitals filed for this visit.   There is no height or weight on file to calculate BMI.      General: Well-developed, well-nourished, no acute distress. Eyes: Pink conjunctiva, anicteric sclera. HEENT: Normocephalic, moist mucous membranes, clear oropharnyx. Lungs: Clear to auscultation bilaterally. Heart: Regular rate and rhythm.  No rubs, murmurs, or gallops. Abdomen: Soft, nontender, nondistended. No organomegaly noted, normoactive bowel sounds. Musculoskeletal: No edema, cyanosis, or clubbing. Neuro: Alert, answering all questions appropriately. Cranial nerves grossly intact. Skin: No rashes or petechiae noted. Psych: Normal affect. Lymphatics: No cervical, calvicular, axillary or inguinal LAD.   LAB RESULTS:  Lab Results  Component Value Date   NA 135 11/05/2018   K 3.4 (L) 11/05/2018   CL 109 11/05/2018   CO2 18 (L) 11/05/2018   GLUCOSE 108 (H) 11/05/2018   BUN 8 11/05/2018   CREATININE 0.54 11/05/2018   CALCIUM 8.3 (L) 11/05/2018   PROT 7.2 11/05/2018   ALBUMIN 2.9 (L) 11/05/2018   AST 25 11/05/2018   ALT 23 11/05/2018   ALKPHOS 243 (H) 11/05/2018   BILITOT 0.6 11/05/2018   GFRNONAA >60 11/05/2018   GFRAA >60 11/05/2018    Lab Results  Component Value Date   WBC 11.4 (H) 11/07/2018   NEUTROABS 10.5 (H) 03/29/2018   HGB 8.8 (L) 11/07/2018   HCT 27.9 (L) 11/07/2018   MCV 80.6 11/07/2018   PLT 296 11/07/2018    No results found for: "TIBC", "FERRITIN", "IRONPCTSAT"   STUDIES: No results found.  ASSESSMENT AND PLAN:   LARRAINE Schmidt is a 37 y.o. female with pmh of anemia, GERD was referred to hematology for further management of iron deficiency anemia.  # Iron deficiency - chronic and progressive. Cannot remember to take oral iron. Has multiple stressors at home.  - likely secondary to heavy menstrual periods.  - Labs reviewed from 06/22/2022.  Iron 33, TIBC 419, saturation 8%.  TSH normal.  Ferritin not available. WBC 8.8, hemoglobin 12, platelet 454. vitamin B12 232, folate 14.6. - IV ferraheme 550 mg weekly x 2. Low but potential risk of anaphylatic reaction discussed. Check urine pregnancy test prior to first dose. Ordered.   - b12 level 232. Low normal. Advised to take b12 1000 mcg daily. She will try.   RTC in 8 weeks for md visit, labs prior  IV ferraheme weekly x  2  Patient expressed understanding and was in agreement with this plan. She also understands that She can call clinic at any time with any questions, concerns, or complaints.   I spent a total of 45 minutes reviewing chart data, face-to-face evaluation with the patient, counseling and coordination of care as detailed above.  Michaelyn Barter, MD   07/04/2022 12:07 PM

## 2022-07-04 NOTE — Progress Notes (Signed)
Patient here today for new evaluation regarding anemia.  

## 2022-07-05 ENCOUNTER — Inpatient Hospital Stay: Payer: Medicaid Other

## 2022-07-05 ENCOUNTER — Inpatient Hospital Stay: Payer: Medicaid Other | Admitting: Internal Medicine

## 2022-07-13 ENCOUNTER — Inpatient Hospital Stay: Payer: Medicaid Other

## 2022-07-13 VITALS — BP 109/62 | HR 71 | Temp 98.2°F | Resp 18

## 2022-07-13 DIAGNOSIS — D500 Iron deficiency anemia secondary to blood loss (chronic): Secondary | ICD-10-CM

## 2022-07-13 DIAGNOSIS — N920 Excessive and frequent menstruation with regular cycle: Secondary | ICD-10-CM | POA: Diagnosis not present

## 2022-07-13 DIAGNOSIS — D509 Iron deficiency anemia, unspecified: Secondary | ICD-10-CM | POA: Diagnosis not present

## 2022-07-13 DIAGNOSIS — Z803 Family history of malignant neoplasm of breast: Secondary | ICD-10-CM | POA: Diagnosis not present

## 2022-07-13 DIAGNOSIS — Z87891 Personal history of nicotine dependence: Secondary | ICD-10-CM | POA: Diagnosis not present

## 2022-07-13 MED ORDER — SODIUM CHLORIDE 0.9 % IV SOLN
510.0000 mg | Freq: Once | INTRAVENOUS | Status: AC
  Administered 2022-07-13: 510 mg via INTRAVENOUS
  Filled 2022-07-13: qty 510

## 2022-07-13 MED ORDER — SODIUM CHLORIDE 0.9 % IV SOLN
Freq: Once | INTRAVENOUS | Status: AC
  Filled 2022-07-13: qty 250

## 2022-07-20 ENCOUNTER — Inpatient Hospital Stay: Payer: Medicaid Other

## 2022-07-20 VITALS — BP 111/65 | HR 81 | Temp 98.8°F | Resp 17

## 2022-07-20 DIAGNOSIS — N920 Excessive and frequent menstruation with regular cycle: Secondary | ICD-10-CM | POA: Diagnosis not present

## 2022-07-20 DIAGNOSIS — Z87891 Personal history of nicotine dependence: Secondary | ICD-10-CM | POA: Diagnosis not present

## 2022-07-20 DIAGNOSIS — D500 Iron deficiency anemia secondary to blood loss (chronic): Secondary | ICD-10-CM

## 2022-07-20 DIAGNOSIS — D509 Iron deficiency anemia, unspecified: Secondary | ICD-10-CM | POA: Diagnosis not present

## 2022-07-20 DIAGNOSIS — Z803 Family history of malignant neoplasm of breast: Secondary | ICD-10-CM | POA: Diagnosis not present

## 2022-07-20 MED ORDER — SODIUM CHLORIDE 0.9 % IV SOLN
510.0000 mg | Freq: Once | INTRAVENOUS | Status: AC
  Administered 2022-07-20: 510 mg via INTRAVENOUS
  Filled 2022-07-20: qty 510

## 2022-07-20 MED ORDER — SODIUM CHLORIDE 0.9 % IV SOLN
Freq: Once | INTRAVENOUS | Status: AC
  Filled 2022-07-20: qty 250

## 2022-09-06 ENCOUNTER — Inpatient Hospital Stay: Payer: Medicaid Other | Attending: Internal Medicine

## 2022-09-06 ENCOUNTER — Other Ambulatory Visit: Payer: Self-pay | Admitting: *Deleted

## 2022-09-06 DIAGNOSIS — D500 Iron deficiency anemia secondary to blood loss (chronic): Secondary | ICD-10-CM

## 2022-09-06 DIAGNOSIS — K529 Noninfective gastroenteritis and colitis, unspecified: Secondary | ICD-10-CM | POA: Diagnosis not present

## 2022-09-06 DIAGNOSIS — K219 Gastro-esophageal reflux disease without esophagitis: Secondary | ICD-10-CM | POA: Diagnosis not present

## 2022-09-06 DIAGNOSIS — D509 Iron deficiency anemia, unspecified: Secondary | ICD-10-CM | POA: Diagnosis not present

## 2022-09-06 DIAGNOSIS — Z87891 Personal history of nicotine dependence: Secondary | ICD-10-CM | POA: Insufficient documentation

## 2022-09-06 DIAGNOSIS — Z803 Family history of malignant neoplasm of breast: Secondary | ICD-10-CM | POA: Insufficient documentation

## 2022-09-06 DIAGNOSIS — N920 Excessive and frequent menstruation with regular cycle: Secondary | ICD-10-CM | POA: Insufficient documentation

## 2022-09-06 DIAGNOSIS — D649 Anemia, unspecified: Secondary | ICD-10-CM

## 2022-09-06 LAB — CBC WITH DIFFERENTIAL/PLATELET
Abs Immature Granulocytes: 0.01 10*3/uL (ref 0.00–0.07)
Basophils Absolute: 0 10*3/uL (ref 0.0–0.1)
Basophils Relative: 1 %
Eosinophils Absolute: 0.2 10*3/uL (ref 0.0–0.5)
Eosinophils Relative: 3 %
HCT: 38 % (ref 36.0–46.0)
Hemoglobin: 12.8 g/dL (ref 12.0–15.0)
Immature Granulocytes: 0 %
Lymphocytes Relative: 33 %
Lymphs Abs: 2 10*3/uL (ref 0.7–4.0)
MCH: 28.6 pg (ref 26.0–34.0)
MCHC: 33.7 g/dL (ref 30.0–36.0)
MCV: 85 fL (ref 80.0–100.0)
Monocytes Absolute: 0.4 10*3/uL (ref 0.1–1.0)
Monocytes Relative: 7 %
Neutro Abs: 3.5 10*3/uL (ref 1.7–7.7)
Neutrophils Relative %: 56 %
Platelets: 346 10*3/uL (ref 150–400)
RBC: 4.47 MIL/uL (ref 3.87–5.11)
RDW: 15.9 % — ABNORMAL HIGH (ref 11.5–15.5)
WBC: 6.2 10*3/uL (ref 4.0–10.5)
nRBC: 0 % (ref 0.0–0.2)

## 2022-09-06 LAB — FERRITIN: Ferritin: 126 ng/mL (ref 11–307)

## 2022-09-06 LAB — IRON AND TIBC
Iron: 71 ug/dL (ref 28–170)
Saturation Ratios: 22 % (ref 10.4–31.8)
TIBC: 319 ug/dL (ref 250–450)
UIBC: 248 ug/dL

## 2022-09-06 LAB — VITAMIN B12: Vitamin B-12: 193 pg/mL (ref 180–914)

## 2022-09-07 ENCOUNTER — Ambulatory Visit: Payer: Medicaid Other | Admitting: Internal Medicine

## 2022-09-07 ENCOUNTER — Inpatient Hospital Stay: Payer: Medicaid Other | Admitting: Medical Oncology

## 2022-09-13 ENCOUNTER — Encounter: Payer: Self-pay | Admitting: Internal Medicine

## 2022-09-13 ENCOUNTER — Inpatient Hospital Stay (HOSPITAL_BASED_OUTPATIENT_CLINIC_OR_DEPARTMENT_OTHER): Payer: Medicaid Other | Admitting: Internal Medicine

## 2022-09-13 VITALS — BP 116/66 | HR 60 | Temp 98.6°F | Resp 20 | Wt 173.0 lb

## 2022-09-13 DIAGNOSIS — R7989 Other specified abnormal findings of blood chemistry: Secondary | ICD-10-CM | POA: Diagnosis not present

## 2022-09-13 DIAGNOSIS — D509 Iron deficiency anemia, unspecified: Secondary | ICD-10-CM | POA: Diagnosis not present

## 2022-09-13 DIAGNOSIS — Z87891 Personal history of nicotine dependence: Secondary | ICD-10-CM | POA: Diagnosis not present

## 2022-09-13 DIAGNOSIS — Z803 Family history of malignant neoplasm of breast: Secondary | ICD-10-CM | POA: Diagnosis not present

## 2022-09-13 DIAGNOSIS — D500 Iron deficiency anemia secondary to blood loss (chronic): Secondary | ICD-10-CM | POA: Diagnosis not present

## 2022-09-13 DIAGNOSIS — K529 Noninfective gastroenteritis and colitis, unspecified: Secondary | ICD-10-CM

## 2022-09-13 DIAGNOSIS — K219 Gastro-esophageal reflux disease without esophagitis: Secondary | ICD-10-CM | POA: Diagnosis not present

## 2022-09-13 DIAGNOSIS — N920 Excessive and frequent menstruation with regular cycle: Secondary | ICD-10-CM | POA: Diagnosis not present

## 2022-09-13 NOTE — Progress Notes (Signed)
Patient has no concerns 

## 2022-09-13 NOTE — Progress Notes (Signed)
[facility]  Telephone:(336) [phone] Fax:(336) [phone]  ID: TOMASA DOBRANSKY OB: 05-Mar-1986  MR#: [id number]  WUX#:[id number]  Patient Care Team: Patient, No Pcp Per as PCP - General (General Practice)   HPI: RAYNE COWDREY is a 36 y.o. female with past medical history of anemia, GERD was referred to hematology for further management of iron deficiency anemia.  Patient reports fatigue. She has lack of concentration. Has occasional dizziness on standing up. Has occasional chest pain which is chronic. She has long standing history of anemia since menarche. She has heavy menstrual periods. It lasts 1 week, uses tampon and pads and have to change every hour on initial few days. Per patient, she was seen by OB and has tried multiple birth control pills with no improvement in vaginal bleeding. It is hard for her to remember taking iron pills. Takes once a week. Her last pregnancy was 3 years ago. She has 4 children. Denies any gastric bypass surgery. Denies bleeding in stools, urine, nose or gum. She has multiple stressors in her life. Her mother recently had surgery for breast cancer. Her brother passed away at age 29 from liver issues from alcohol use.   Interval history Patient was seen today to discuss labs. She had 2 infusions of IV Feraheme which she tolerated well.  Patient did not feel any better in regards to her symptoms after iron.  She continues to feel fatigued and tired.  She has 7-year-old at home.  She works for [employer] and has been very strenuous recently with Christmas.  For past 2 months, her menstrual period has been lasting fewer days with less bleeding.   REVIEW OF SYSTEMS:   ROS  As per HPI. Otherwise, a complete review of systems is negative.  PAST MEDICAL HISTORY: Past Medical History:  Diagnosis Date   Anemia    Anxiety    NO MEDS   Cholestasis during pregnancy in third trimester 11/05/2018   GERD (gastroesophageal reflux disease)    NO MEDS    Headache    FREQUENT HA'S   Seizure ([facility])    LAST 2017   Vaginal Pap smear, abnormal     PAST SURGICAL HISTORY: Past Surgical History:  Procedure Laterality Date   denies surgical history     NO PAST SURGERIES      FAMILY HISTORY: Family History  Problem Relation Age of Onset   Diabetes Paternal Grandfather    Diabetes Paternal Grandmother    Cancer Maternal Grandmother    Diabetes Maternal Grandmother    Lung disease Maternal Grandmother    Diabetes Maternal Grandfather    Hypertension Maternal Grandfather    Diabetes Father    Cancer Mother    Diabetes Mother    Hypertension Mother    Seizures Mother    Liver disease Mother    Clotting disorder Mother     HEALTH MAINTENANCE: Social History   Tobacco Use   Smoking status: Former    Packs/day: 0.25    Years: 17.00    Total pack years: 4.25    Types: Cigarettes    Quit date: 09/10/2017    Years since quitting: 5.0    Passive exposure: Never   Smokeless tobacco: Never  Vaping Use   Vaping Use: Never used  Substance Use Topics   Alcohol use: Yes    Alcohol/week: 3.0 standard drinks of alcohol    Types: 3 Glasses of wine per week    Comment: last use 04/24/21 2x/wk   Drug  use: Yes    Types: Marijuana    Comment: last use 04/25/21     No Known Allergies  Current Outpatient Medications  Medication Sig Dispense Refill   ELURYNG 0.12-0.015 MG/24HR vaginal ring Place vaginally.     ibuprofen (ADVIL) 200 MG tablet Take 200 mg by mouth every 6 (six) hours as needed.     No current facility-administered medications for this visit.    OBJECTIVE: Vitals:   09/13/22 1408  BP: 116/66  Pulse: 60  Resp: 20  Temp: 98.6 F (37 C)  SpO2: 100%     Body mass index is 33.79 kg/m.      General: Well-developed, well-nourished, no acute distress. Eyes: Pink conjunctiva, anicteric sclera. HEENT: Normocephalic, moist mucous membranes, clear oropharnyx. Lungs: Clear to auscultation bilaterally. Heart: Regular  rate and rhythm. No rubs, murmurs, or gallops. Abdomen: Soft, nontender, nondistended. No organomegaly noted, normoactive bowel sounds. Musculoskeletal: No edema, cyanosis, or clubbing. Neuro: Alert, answering all questions appropriately. Cranial nerves grossly intact. Skin: No rashes or petechiae noted. Psych: Normal affect. Lymphatics: No cervical, calvicular, axillary or inguinal LAD.   LAB RESULTS:  Lab Results  Component Value Date   NA 135 11/05/2018   K 3.4 (L) 11/05/2018   CL 109 11/05/2018   CO2 18 (L) 11/05/2018   GLUCOSE 108 (H) 11/05/2018   BUN 8 11/05/2018   CREATININE 0.54 11/05/2018   CALCIUM 8.3 (L) 11/05/2018   PROT 7.2 11/05/2018   ALBUMIN 2.9 (L) 11/05/2018   AST 25 11/05/2018   ALT 23 11/05/2018   ALKPHOS 243 (H) 11/05/2018   BILITOT 0.6 11/05/2018   GFRNONAA >60 11/05/2018   GFRAA >60 11/05/2018    Lab Results  Component Value Date   WBC 6.2 09/06/2022   NEUTROABS 3.5 09/06/2022   HGB 12.8 09/06/2022   HCT 38.0 09/06/2022   MCV 85.0 09/06/2022   PLT 346 09/06/2022    Lab Results  Component Value Date   TIBC 319 09/06/2022   FERRITIN 126 09/06/2022   IRONPCTSAT 22 09/06/2022     STUDIES: No results found.  ASSESSMENT AND PLAN:   SKYLYNNE SCHLECHTER is a 36 y.o. female with pmh of anemia, GERD was referred to hematology for further management of iron deficiency anemia.  # Iron deficiency - Received IV Feraheme x 2.  Repeat labs showed excellent response.  -Could be from menstrual period.  Denies any bleeding in stool.  But she does report chronic diarrhea multiple times for past couple of years.  Referral to GI made.  # Low B12 levels -Patient declined B12 injections.  She will try to be more compliant with B12 pills and work on her diet.  RTC in 5 months for MD visit and labs.  Patient expressed understanding and was in agreement with this plan. She also understands that She can call clinic at any time with any questions, concerns, or  complaints.   I spent a total of 45 minutes reviewing chart data, face-to-face evaluation with the patient, counseling and coordination of care as detailed above.  Michaelyn Barter, MD   09/13/2022 2:13 PM

## 2022-12-21 DIAGNOSIS — E559 Vitamin D deficiency, unspecified: Secondary | ICD-10-CM | POA: Diagnosis not present

## 2022-12-21 DIAGNOSIS — E039 Hypothyroidism, unspecified: Secondary | ICD-10-CM | POA: Diagnosis not present

## 2022-12-21 DIAGNOSIS — R5381 Other malaise: Secondary | ICD-10-CM | POA: Diagnosis not present

## 2022-12-21 DIAGNOSIS — K219 Gastro-esophageal reflux disease without esophagitis: Secondary | ICD-10-CM | POA: Diagnosis not present

## 2022-12-21 DIAGNOSIS — E785 Hyperlipidemia, unspecified: Secondary | ICD-10-CM | POA: Diagnosis not present

## 2022-12-21 DIAGNOSIS — J4520 Mild intermittent asthma, uncomplicated: Secondary | ICD-10-CM | POA: Diagnosis not present

## 2022-12-21 DIAGNOSIS — E119 Type 2 diabetes mellitus without complications: Secondary | ICD-10-CM | POA: Diagnosis not present

## 2022-12-21 DIAGNOSIS — R5383 Other fatigue: Secondary | ICD-10-CM | POA: Diagnosis not present

## 2023-01-11 ENCOUNTER — Other Ambulatory Visit: Payer: Self-pay

## 2023-01-17 ENCOUNTER — Other Ambulatory Visit: Payer: Self-pay

## 2023-01-17 ENCOUNTER — Encounter: Payer: Self-pay | Admitting: Internal Medicine

## 2023-01-17 ENCOUNTER — Encounter: Payer: Self-pay | Admitting: Gastroenterology

## 2023-01-17 ENCOUNTER — Ambulatory Visit (INDEPENDENT_AMBULATORY_CARE_PROVIDER_SITE_OTHER): Payer: Medicaid Other | Admitting: Gastroenterology

## 2023-01-17 VITALS — BP 110/74 | HR 81 | Temp 98.2°F | Ht 60.0 in | Wt 176.0 lb

## 2023-01-17 DIAGNOSIS — D509 Iron deficiency anemia, unspecified: Secondary | ICD-10-CM

## 2023-01-17 DIAGNOSIS — R072 Precordial pain: Secondary | ICD-10-CM | POA: Diagnosis not present

## 2023-01-17 DIAGNOSIS — K529 Noninfective gastroenteritis and colitis, unspecified: Secondary | ICD-10-CM

## 2023-01-17 MED ORDER — NA SULFATE-K SULFATE-MG SULF 17.5-3.13-1.6 GM/177ML PO SOLN: 354.0000 mL | Freq: Once | ORAL | 0 refills | Status: AC

## 2023-01-17 NOTE — Progress Notes (Signed)
Arlyss Repress, MD [address]  [address]  [place], [place] [address]  Main: [phone]  Fax: [phone]    Gastroenterology Consultation  Referring Provider:     Michaelyn Barter, MD Primary Care Physician:  Patient, No Pcp Per Primary Gastroenterologist:  Dr. Arlyss Repress Reason for Consultation: Iron deficiency anemia, chronic diarrhea        HPI:   Angie Schmidt is a 37 y.o. female referred by Patient, No Pcp Per  for consultation & management of iron deficiency anemia and chronic diarrhea.  Patient is diagnosed with severe iron deficiency anemia in 11/2018, her lowest hemoglobin was 8.8, normal MCV.  She is treated as iron deficiency by hematologist, Dr. Michae Kava, received 2 iron infusions and anemia has resolved.  Her hemoglobin was 12.8 in 09/2022.  She had history of menorrhagia, received oral contraceptive pills for few months, she was worried about the side effects, therefore discontinued the medication.  However, her menstrual flow has significantly improved and continues to be the same.  She is also concerned about approximately 2 years history of watery to mushy, nonbloody bowel movements up to 7 times daily, denies any nocturnal diarrhea, associated with abdominal bloating, diarrhea is mostly postprandial, associated with abdominal cramps.  She also reports substernal chest pain for which she was given acid reflux medication.  She denies any right upper quadrant discomfort.  She is concerned if she has gallstones.  She does not smoke or drink alcohol Mother with colon cancer at age 62  NSAIDs: None  Antiplts/Anticoagulants/Anti thrombotics: None  GI Procedures: None  Past Medical History:  Diagnosis Date   Anemia    Anxiety    NO MEDS   Cholestasis during pregnancy in third trimester 11/05/2018   GERD (gastroesophageal reflux disease)    NO MEDS   Headache    FREQUENT HA'S   Seizure    LAST 2017   Vaginal Pap smear, abnormal     Past Surgical  History:  Procedure Laterality Date   denies surgical history     NO PAST SURGERIES       Current Outpatient Medications:    ibuprofen (ADVIL) 200 MG tablet, Take 200 mg by mouth every 6 (six) hours as needed., Disp: , Rfl:    Na Sulfate-K Sulfate-Mg Sulf 17.5-3.13-1.6 GM/177ML SOLN, Take 354 mLs by mouth once for 1 dose., Disp: 354 mL, Rfl: 0   omeprazole (PRILOSEC) 40 MG capsule, Take 40 mg by mouth daily., Disp: , Rfl:    VENTOLIN HFA 108 (90 Base) MCG/ACT inhaler, Inhale 2 puffs into the lungs every 6 (six) hours as needed., Disp: , Rfl:    Family History  Problem Relation Age of Onset   Diabetes Paternal Grandfather    Diabetes Paternal Grandmother    Cancer Maternal Grandmother    Diabetes Maternal Grandmother    Lung disease Maternal Grandmother    Diabetes Maternal Grandfather    Hypertension Maternal Grandfather    Diabetes Father    Cancer Mother    Diabetes Mother    Hypertension Mother    Seizures Mother    Liver disease Mother    Clotting disorder Mother      Social History   Tobacco Use   Smoking status: Former    Packs/day: 0.25    Years: 17.00    Additional pack years: 0.00    Total pack years: 4.25    Types: Cigarettes    Quit date: 09/10/2017    Years  since quitting: 5.3    Passive exposure: Never   Smokeless tobacco: Never  Vaping Use   Vaping Use: Never used  Substance Use Topics   Alcohol use: Yes    Alcohol/week: 3.0 standard drinks of alcohol    Types: 3 Glasses of wine per week    Comment: last use 04/24/21 2x/wk   Drug use: Yes    Types: Marijuana    Comment: last use 04/25/21    Allergies as of 01/17/2023   (No Known Allergies)    Review of Systems:    All systems reviewed and negative except where noted in HPI.   Physical Exam:  BP 110/74 (BP Location: Left Arm, Patient Position: Sitting, Cuff Size: Normal)   Pulse 81   Temp 98.2 F (36.8 C) (Oral)   Ht 5' (1.524 m)   Wt 176 lb (79.8 kg)   BMI 34.37 kg/m  No LMP  recorded.  General:   Alert,  Well-developed, well-nourished, pleasant and cooperative in NAD Head:  Normocephalic and atraumatic. Eyes:  Sclera clear, no icterus.   Conjunctiva pink. Ears:  Normal auditory acuity. Nose:  No deformity, discharge, or lesions. Mouth:  No deformity or lesions,oropharynx pink & moist. Neck:  Supple; no masses or thyromegaly. Lungs:  Respirations even and unlabored.  Clear throughout to auscultation.   No wheezes, crackles, or rhonchi. No acute distress. Heart:  Regular rate and rhythm; no murmurs, clicks, rubs, or gallops. Abdomen:  Normal bowel sounds. Soft, non-tender and non-distended without masses, hepatosplenomegaly or hernias noted.  No guarding or rebound tenderness.   Rectal: Not performed Msk:  Symmetrical without gross deformities. Good, equal movement & strength bilaterally. Pulses:  Normal pulses noted. Extremities:  No clubbing or edema.  No cyanosis. Neurologic:  Alert and oriented x3;  grossly normal neurologically. Skin:  Intact without significant lesions or rashes. No jaundice. Psych:  Alert and cooperative. Normal mood and affect.  Imaging Studies: No abdominal imaging  Assessment and Plan:   Angie Schmidt is a 37 y.o. pleasant female with history of menorrhagia, iron deficiency anemia, chronic diarrhea  Recommend EGD and colonoscopy with possible TI evaluation, random colon biopsies Recommend food allergy profile and alpha gal panel  Substernal chest pain If EGD is negative, evaluate for gallbladder etiology   Follow up based on the above workup   Arlyss Repress, MD

## 2023-01-21 LAB — FOOD ALLERGY PROFILE
Allergen Corn, IgE: <0.1 kU/L
Clam IgE: 0.15 kU/L — AB
Codfish IgE: <0.1 kU/L
Egg White IgE: <0.1 kU/L
Milk IgE: <0.1 kU/L
Peanut IgE: 0.13 kU/L — AB
Scallop IgE: 0.16 kU/L — AB
Sesame Seed IgE: 0.42 kU/L — AB
Shrimp IgE: 0.3 kU/L — AB
Soybean IgE: <0.1 kU/L
Walnut IgE: <0.1 kU/L
Wheat IgE: <0.1 kU/L

## 2023-01-21 LAB — ALPHA-GAL PANEL
Allergen Lamb IgE: <0.1 kU/L
Beef IgE: <0.1 kU/L
IgE (Immunoglobulin E), Serum: 237 IU/mL (ref 6–495)
O215-IgE Alpha-Gal: <0.1 kU/L
Pork IgE: <0.1 kU/L

## 2023-01-23 ENCOUNTER — Telehealth: Payer: Self-pay

## 2023-01-23 NOTE — Telephone Encounter (Signed)
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  Recommend to eliminate these foods for at least 3 months and see if it helps with her symptoms.  I will see her for upper endoscopy and colonoscopy as scheduled  Angie Schmidt

## 2023-01-23 NOTE — Telephone Encounter (Signed)
Patient verbalized understanding of results  

## 2023-02-07 ENCOUNTER — Encounter: Payer: Self-pay | Admitting: Gastroenterology

## 2023-02-08 NOTE — Anesthesia Preprocedure Evaluation (Addendum)
Anesthesia Evaluation  Patient identified by MRN, date of birth, ID band Patient awake    Reviewed: Allergy & Precautions, H&P , NPO status , Patient's Chart, lab work & pertinent test results  Airway Mallampati: I  TM Distance: >3 FB Neck ROM: Full    Dental no notable dental hx.    Pulmonary asthma , former smoker   Pulmonary exam normal breath sounds clear to auscultation       Cardiovascular negative cardio ROS Normal cardiovascular exam Rhythm:Regular Rate:Normal     Neuro/Psych  Headaches, Seizures -,   Anxiety      negative psych ROS   GI/Hepatic Neg liver ROS,GERD  ,,  Endo/Other  negative endocrine ROS    Renal/GU negative Renal ROS  negative genitourinary   Musculoskeletal negative musculoskeletal ROS (+)    Abdominal   Peds negative pediatric ROS (+)  Hematology  (+) Blood dyscrasia, anemia   Anesthesia Other Findings Anxiety  GERD (gastroesophageal reflux disease) Seizure (HCC) Headache Iron deficiency Anemia  Cholestasis during pregnancy in third trimester Vaginal Pap smear, abnormal Asthma Motion sickness     Reproductive/Obstetrics negative OB ROS                             Anesthesia Physical Anesthesia Plan  ASA: 3  Anesthesia Plan: General   Post-op Pain Management:    Induction: Intravenous  PONV Risk Score and Plan:   Airway Management Planned: Natural Airway and Nasal Cannula  Additional Equipment:   Intra-op Plan:   Post-operative Plan:   Informed Consent: I have reviewed the patients History and Physical, chart, labs and discussed the procedure including the risks, benefits and alternatives for the proposed anesthesia with the patient or authorized representative who has indicated his/her understanding and acceptance.     Dental Advisory Given  Plan Discussed with: Anesthesiologist, CRNA and Surgeon  Anesthesia Plan Comments: (Patient  consented for risks of anesthesia including but not limited to:  - adverse reactions to medications - risk of airway placement if required - damage to eyes, teeth, lips or other oral mucosa - nerve damage due to positioning  - sore throat or hoarseness - Damage to heart, brain, nerves, lungs, other parts of body or loss of life  Patient voiced understanding.)        Anesthesia Quick Evaluation

## 2023-02-13 ENCOUNTER — Encounter: Payer: Self-pay | Admitting: Internal Medicine

## 2023-02-13 ENCOUNTER — Inpatient Hospital Stay (HOSPITAL_BASED_OUTPATIENT_CLINIC_OR_DEPARTMENT_OTHER): Payer: Medicaid Other | Admitting: Internal Medicine

## 2023-02-13 ENCOUNTER — Inpatient Hospital Stay: Payer: Medicaid Other | Attending: Internal Medicine

## 2023-02-13 VITALS — BP 115/62 | HR 74 | Temp 98.7°F | Wt 179.0 lb

## 2023-02-13 DIAGNOSIS — D500 Iron deficiency anemia secondary to blood loss (chronic): Secondary | ICD-10-CM

## 2023-02-13 DIAGNOSIS — E538 Deficiency of other specified B group vitamins: Secondary | ICD-10-CM | POA: Diagnosis not present

## 2023-02-13 DIAGNOSIS — K529 Noninfective gastroenteritis and colitis, unspecified: Secondary | ICD-10-CM | POA: Insufficient documentation

## 2023-02-13 DIAGNOSIS — D509 Iron deficiency anemia, unspecified: Secondary | ICD-10-CM | POA: Diagnosis not present

## 2023-02-13 DIAGNOSIS — Z800 Family history of malignant neoplasm of digestive organs: Secondary | ICD-10-CM | POA: Diagnosis not present

## 2023-02-13 DIAGNOSIS — Z803 Family history of malignant neoplasm of breast: Secondary | ICD-10-CM | POA: Diagnosis not present

## 2023-02-13 DIAGNOSIS — Z87891 Personal history of nicotine dependence: Secondary | ICD-10-CM | POA: Insufficient documentation

## 2023-02-13 DIAGNOSIS — R079 Chest pain, unspecified: Secondary | ICD-10-CM | POA: Diagnosis not present

## 2023-02-13 DIAGNOSIS — N920 Excessive and frequent menstruation with regular cycle: Secondary | ICD-10-CM | POA: Insufficient documentation

## 2023-02-13 DIAGNOSIS — R7989 Other specified abnormal findings of blood chemistry: Secondary | ICD-10-CM

## 2023-02-13 LAB — IRON AND TIBC
Iron: 75 ug/dL (ref 28–170)
Saturation Ratios: 23 % (ref 10.4–31.8)
TIBC: 333 ug/dL (ref 250–450)
UIBC: 258 ug/dL

## 2023-02-13 LAB — CBC WITH DIFFERENTIAL/PLATELET
Abs Immature Granulocytes: 0.01 10*3/uL (ref 0.00–0.07)
Basophils Absolute: 0 10*3/uL (ref 0.0–0.1)
Basophils Relative: 0 %
Eosinophils Absolute: 0.1 10*3/uL (ref 0.0–0.5)
Eosinophils Relative: 1 %
HCT: 37.9 % (ref 36.0–46.0)
Hemoglobin: 12.5 g/dL (ref 12.0–15.0)
Immature Granulocytes: 0 %
Lymphocytes Relative: 36 %
Lymphs Abs: 2.5 10*3/uL (ref 0.7–4.0)
MCH: 29.2 pg (ref 26.0–34.0)
MCHC: 33 g/dL (ref 30.0–36.0)
MCV: 88.6 fL (ref 80.0–100.0)
Monocytes Absolute: 0.4 10*3/uL (ref 0.1–1.0)
Monocytes Relative: 5 %
Neutro Abs: 4 10*3/uL (ref 1.7–7.7)
Neutrophils Relative %: 58 %
Platelets: 359 10*3/uL (ref 150–400)
RBC: 4.28 MIL/uL (ref 3.87–5.11)
RDW: 12.1 % (ref 11.5–15.5)
WBC: 6.9 10*3/uL (ref 4.0–10.5)
nRBC: 0 % (ref 0.0–0.2)

## 2023-02-13 LAB — FERRITIN: Ferritin: 42 ng/mL (ref 11–307)

## 2023-02-13 LAB — VITAMIN B12: Vitamin B-12: 209 pg/mL (ref 180–914)

## 2023-02-13 NOTE — Progress Notes (Signed)
Pt. Is having more trouble sleeping at night.

## 2023-02-13 NOTE — Progress Notes (Signed)
[facility]  [phone] [phone]  ID: Angie Schmidt OB: [birth year]  MR#: [id number]  VHQ#:[id number]  Patient Care Team: Patient, No Pcp Per as PCP - General (General Practice)   HPI: Angie Schmidt is a 37 y.o. female with past medical history of anemia, GERD was referred to hematology for further management of iron deficiency anemia.  Patient reports fatigue. She has lack of concentration. Has occasional dizziness on standing up. Has occasional chest pain which is chronic. She has long standing history of anemia since menarche. She has heavy menstrual periods. It lasts 1 week, uses tampon and pads and have to change every hour on initial few days. Per patient, she was seen by OB and has tried multiple birth control pills with no improvement in vaginal bleeding. It is hard for her to remember taking iron pills. Takes once a week. Her last pregnancy was 3 years ago. She has 4 children. Denies any gastric bypass surgery. Denies bleeding in stools, urine, nose or gum. She has multiple stressors in her life. Her mother recently had surgery for breast cancer. Her brother passed away at age 34 from liver issues from alcohol use.   Completed IV Feraheme x 2 doses in October 2023. Seen by Dr. Allegra Lai on 01/17/2023 for chronic diarrhea and IDA.  History of mother with colon cancer at age 68.  She is planned for EGD and colonoscopy.  Interval history Patient seen today as follow-up for IDA and labs.   Doing well overall.  Scheduled for colonoscopy and endoscopy tomorrow for her chronic diarrhea and iron deficiency anemia.  She is on liquid diet today and feeling little dizzy.  Gave her drink in the clinic.  She will go home and will rest.  Menstrual cycles are no longer that heavy.   REVIEW OF SYSTEMS:   ROS  As per HPI. Otherwise, a complete review of systems is negative.  PAST MEDICAL HISTORY: Past Medical History:  Diagnosis Date   Anemia     Anxiety    NO MEDS   Asthma    Cholestasis during pregnancy in third trimester 11/05/2018   GERD (gastroesophageal reflux disease)    NO MEDS   Headache    FREQUENT HA'S   Motion sickness    Seizure (HCC)    LAST 2018   Vaginal Pap smear, abnormal     PAST SURGICAL HISTORY: Past Surgical History:  Procedure Laterality Date   denies surgical history     NO PAST SURGERIES      FAMILY HISTORY: Family History  Problem Relation Age of Onset   Diabetes Paternal Grandfather    Diabetes Paternal Grandmother    Cancer Maternal Grandmother    Diabetes Maternal Grandmother    Lung disease Maternal Grandmother    Diabetes Maternal Grandfather    Hypertension Maternal Grandfather    Diabetes Father    Cancer Mother    Diabetes Mother    Hypertension Mother    Seizures Mother    Liver disease Mother    Clotting disorder Mother     HEALTH MAINTENANCE: Social History   Tobacco Use   Smoking status: Former    Packs/day: 0.25    Years: 17.00    Additional pack years: 0.00    Total pack years: 4.25    Types: Cigarettes    Quit date: 09/10/2017    Years since quitting: 5.4    Passive exposure: Never   Smokeless tobacco: Never  Vaping Use  Vaping Use: Never used  Substance Use Topics   Alcohol use: Yes    Alcohol/week: 3.0 standard drinks of alcohol    Types: 3 Glasses of wine per week    Comment: occasionally   Drug use: Yes    Types: Marijuana    Comment: last use 04/25/21     No Known Allergies  Current Outpatient Medications  Medication Sig Dispense Refill   ibuprofen (ADVIL) 200 MG tablet Take 200 mg by mouth every 6 (six) hours as needed.     omeprazole (PRILOSEC) 40 MG capsule Take 40 mg by mouth daily.     VENTOLIN HFA 108 (90 Base) MCG/ACT inhaler Inhale 2 puffs into the lungs every 6 (six) hours as needed.     No current facility-administered medications for this visit.    OBJECTIVE: Vitals:   02/13/23 1339  BP: 115/62  Pulse: 74  Temp: 98.7 F  (37.1 C)  SpO2: 100%     Body mass index is 34.96 kg/m.      General: Well-developed, well-nourished, no acute distress. Eyes: Pink conjunctiva, anicteric sclera. HEENT: Normocephalic, moist mucous membranes, clear oropharnyx. Lungs: Clear to auscultation bilaterally. Heart: Regular rate and rhythm. No rubs, murmurs, or gallops. Abdomen: Soft, nontender, nondistended. No organomegaly noted, normoactive bowel sounds. Musculoskeletal: No edema, cyanosis, or clubbing. Neuro: Alert, answering all questions appropriately. Cranial nerves grossly intact. Skin: No rashes or petechiae noted. Psych: Normal affect. Lymphatics: No cervical, calvicular, axillary or inguinal LAD.   LAB RESULTS:  Lab Results  Component Value Date   NA 135 11/05/2018   K 3.4 (L) 11/05/2018   CL 109 11/05/2018   CO2 18 (L) 11/05/2018   GLUCOSE 108 (H) 11/05/2018   BUN 8 11/05/2018   CREATININE 0.54 11/05/2018   CALCIUM 8.3 (L) 11/05/2018   PROT 7.2 11/05/2018   ALBUMIN 2.9 (L) 11/05/2018   AST 25 11/05/2018   ALT 23 11/05/2018   ALKPHOS 243 (H) 11/05/2018   BILITOT 0.6 11/05/2018   GFRNONAA >60 11/05/2018   GFRAA >60 11/05/2018    Lab Results  Component Value Date   WBC 6.9 02/13/2023   NEUTROABS 4.0 02/13/2023   HGB 12.5 02/13/2023   HCT 37.9 02/13/2023   MCV 88.6 02/13/2023   PLT 359 02/13/2023    Lab Results  Component Value Date   TIBC 319 09/06/2022   FERRITIN 126 09/06/2022   IRONPCTSAT 22 09/06/2022     STUDIES: No results found.  ASSESSMENT AND PLAN:   Angie Schmidt is a 37 y.o. female with pmh of anemia, GERD was referred to hematology for further management of iron deficiency anemia.  # Iron deficiency anemia -Likely secondary to menstrual period.  Completed IV Feraheme x 2 doses in October 2023.  Had good response to treatment.  Hemoglobin today 12.5.  Iron panel pending. -Was evaluated by Dr. Allegra Lai. Scheduled for endoscopy and colonoscopy tomorrow.  # Vitamin B12  deficiency -B12 level from December 2023 193.  Declined B12 injections and wanted to try pills. -Reports that she could not remember to take pills.  Would like to wait for B12 levels to come back and if still low will schedule her for B12 1000 mcg injection weekly x 4 doses.  # Chronic diarrhea -Follows with Dr. Allegra Lai.  Scheduled for endoscopy and colonoscopy tomorrow. -Alpha gal panel negative.  Food allergy panel showed sensitivity to peanut, shrimp, scallop and sesame.  Reports she rarely eats peanut butter and does not eat other food.  Orders Placed  This Encounter  Procedures   CBC with Differential/Platelet   Iron and TIBC   Ferritin   Vitamin B12    RTC in 6 months for MD visit, labs.  Patient expressed understanding and was in agreement with this plan. She also understands that She can call clinic at any time with any questions, concerns, or complaints.   I spent a total of 25 minutes reviewing chart data, face-to-face evaluation with the patient, counseling and coordination of care as detailed above.  Michaelyn Barter, MD   02/13/2023 2:01 PM

## 2023-02-14 ENCOUNTER — Ambulatory Visit
Admission: RE | Admit: 2023-02-14 | Discharge: 2023-02-14 | Disposition: A | Discharge status: Home / Self Care | Payer: Medicaid Other | Attending: Gastroenterology | Admitting: Gastroenterology

## 2023-02-14 ENCOUNTER — Encounter: Payer: Self-pay | Admitting: *Deleted

## 2023-02-14 ENCOUNTER — Ambulatory Visit: Payer: Medicaid Other | Admitting: Anesthesiology

## 2023-02-14 ENCOUNTER — Other Ambulatory Visit: Payer: Self-pay

## 2023-02-14 ENCOUNTER — Encounter
Admission: RE | Disposition: A | Discharge status: Home / Self Care | Payer: Self-pay | Source: Home / Self Care | Attending: Gastroenterology

## 2023-02-14 ENCOUNTER — Encounter: Payer: Self-pay | Admitting: Gastroenterology

## 2023-02-14 DIAGNOSIS — Z87891 Personal history of nicotine dependence: Secondary | ICD-10-CM | POA: Diagnosis not present

## 2023-02-14 DIAGNOSIS — K529 Noninfective gastroenteritis and colitis, unspecified: Secondary | ICD-10-CM

## 2023-02-14 DIAGNOSIS — D509 Iron deficiency anemia, unspecified: Secondary | ICD-10-CM

## 2023-02-14 DIAGNOSIS — F419 Anxiety disorder, unspecified: Secondary | ICD-10-CM | POA: Insufficient documentation

## 2023-02-14 DIAGNOSIS — J45909 Unspecified asthma, uncomplicated: Secondary | ICD-10-CM | POA: Diagnosis not present

## 2023-02-14 DIAGNOSIS — K219 Gastro-esophageal reflux disease without esophagitis: Secondary | ICD-10-CM | POA: Diagnosis not present

## 2023-02-14 DIAGNOSIS — Z79899 Other long term (current) drug therapy: Secondary | ICD-10-CM | POA: Diagnosis not present

## 2023-02-14 HISTORY — DX: Motion sickness, initial encounter: T75.3XXA

## 2023-02-14 HISTORY — DX: Unspecified asthma, uncomplicated: J45.909

## 2023-02-14 HISTORY — PX: COLONOSCOPY WITH PROPOFOL: SHX5780

## 2023-02-14 HISTORY — PX: ESOPHAGOGASTRODUODENOSCOPY (EGD) WITH PROPOFOL: SHX5813

## 2023-02-14 LAB — POCT PREGNANCY, URINE: Preg Test, Ur: NEGATIVE

## 2023-02-14 SURGERY — COLONOSCOPY WITH PROPOFOL
Anesthesia: General

## 2023-02-14 MED ORDER — PROPOFOL 10 MG/ML IV BOLUS
INTRAVENOUS | Status: DC | PRN
  Administered 2023-02-14: 100 mg via INTRAVENOUS

## 2023-02-14 MED ORDER — LACTATED RINGERS IV SOLN: INTRAVENOUS | Status: DC

## 2023-02-14 MED ORDER — SODIUM CHLORIDE 0.9 % IV SOLN: INTRAVENOUS | Status: DC

## 2023-02-14 MED ORDER — STERILE WATER FOR IRRIGATION IR SOLN
Status: DC | PRN
  Administered 2023-02-14: 1000 mL

## 2023-02-14 MED ORDER — PROPOFOL 500 MG/50ML IV EMUL
INTRAVENOUS | Status: DC | PRN
  Administered 2023-02-14: 125 ug/kg/min via INTRAVENOUS

## 2023-02-14 MED ORDER — LIDOCAINE HCL (CARDIAC) PF 100 MG/5ML IV SOSY
PREFILLED_SYRINGE | INTRAVENOUS | Status: DC | PRN
  Administered 2023-02-14: 50 mg via INTRAVENOUS

## 2023-02-14 SURGICAL SUPPLY — 10 items
BLOCK BITE 60FR ADLT L/F GRN (MISCELLANEOUS) ×1 IMPLANT
FCP ESCP3.2XJMB 240X2.8X (MISCELLANEOUS) ×2
FORCEPS BIOP RJ4 240 W/NDL (MISCELLANEOUS) ×2
FORCEPS ESCP3.2XJMB 240X2.8X (MISCELLANEOUS) IMPLANT
GOWN CVR UNV OPN BCK APRN NK (MISCELLANEOUS) ×2 IMPLANT
GOWN ISOL THUMB LOOP REG UNIV (MISCELLANEOUS) ×2
KIT PRC NS LF DISP ENDO (KITS) ×1 IMPLANT
KIT PROCEDURE OLYMPUS (KITS) ×1
MANIFOLD NEPTUNE II (INSTRUMENTS) ×1 IMPLANT
WATER STERILE IRR 250ML POUR (IV SOLUTION) ×1 IMPLANT

## 2023-02-14 NOTE — Progress Notes (Signed)
Can we please send mychart message- B12 continues to be on lower side. Recommend b12 inj weekly x 3 doses. Please let us know and will schedule. Thank you.

## 2023-02-14 NOTE — Op Note (Signed)
[facility] Patient Name: Rheannon Spiers Procedure Date: 02/14/2023 8:16 AM MRN: [id number] Account #: [id number] Date of Birth: [birth year] Admit Type: Outpatient Age: 37 Room: [facility] OR ROOM 01 Gender: Female Note Status: Finalized Instrument Name: [id number] Procedure:             Colonoscopy Indications:           This is the patient's first colonoscopy, Chronic                         diarrhea Providers:             [name] MD, MD Referring MD:           family peds Medicines:             General Anesthesia Complications:         No immediate complications. Estimated blood loss: None. Procedure:             Pre-Anesthesia Assessment:                        - Prior to the procedure, a History and Physical was                         performed, and patient medications and allergies were                         reviewed. The patient is competent. The risks and                         benefits of the procedure and the sedation options and                         risks were discussed with the patient. All questions                         were answered and informed consent was obtained.                         Patient identification and proposed procedure were                         verified by the physician, the nurse, the                         anesthesiologist, the anesthetist and the technician                         in the pre-procedure area in the procedure room in the                         endoscopy suite. Mental Status Examination: alert and                         oriented. Airway Examination: normal oropharyngeal                         airway and neck mobility. Respiratory Examination:  clear to auscultation. CV Examination: normal.                         Prophylactic Antibiotics: The patient does not require                         prophylactic antibiotics. Prior Anticoagulants: The                          patient has taken no anticoagulant or antiplatelet                         agents. ASA Grade Assessment: III - A patient with                         severe systemic disease. After reviewing the risks and                         benefits, the patient was deemed in satisfactory                         condition to undergo the procedure. The anesthesia                         plan was to use general anesthesia. Immediately prior                         to administration of medications, the patient was                         re-assessed for adequacy to receive sedatives. The                         heart rate, respiratory rate, oxygen saturations,                         blood pressure, adequacy of pulmonary ventilation, and                         response to care were monitored throughout the                         procedure. The physical status of the patient was                         re-assessed after the procedure.                        After obtaining informed consent, the colonoscope was                         passed under direct vision. Throughout the procedure,                         the patient's blood pressure, pulse, and oxygen                         saturations were monitored continuously. The  Colonoscope was introduced through the anus and                         advanced to the the terminal ileum, with                         identification of the appendiceal orifice and IC                         valve. The colonoscopy was performed without                         difficulty. The patient tolerated the procedure well.                         The quality of the bowel preparation was evaluated                         using the BBPS [facility] Bowel Preparation Scale) with                         scores of: Right Colon = 3, Transverse Colon = 3 and                         Left Colon = 3 (entire mucosa seen well with no                          residual staining, small fragments of stool or opaque                         liquid). The total BBPS score equals 9. The terminal                         ileum, ileocecal valve, appendiceal orifice, and                         rectum were photographed. Findings:      The perianal and digital rectal examinations were normal. Pertinent       negatives include normal sphincter tone and no palpable rectal lesions.      The terminal ileum appeared normal.      The entire examined colon appeared normal. Biopsies were taken with a       cold forceps for histology.      The retroflexed view of the distal rectum and anal verge was normal and       showed no anal or rectal abnormalities. Impression:            - The examined portion of the ileum was normal.                        - The entire examined colon is normal. Biopsied.                        - The distal rectum and anal verge are normal on                         retroflexion view. Recommendation:        -  Discharge patient to home (with escort).                        - Resume previous diet today.                        - Continue present medications.                        - Await pathology results.                        - Return to GI clinic at appointment to be scheduled. Procedure Code(s):     --- Professional ---                        [phone], Colonoscopy, flexible; with biopsy, single or                         multiple Diagnosis Code(s):     --- Professional ---                        K52.9, Noninfective gastroenteritis and colitis,                         unspecified CPT copyright 2022 American Medical Association. All rights reserved. The codes documented in this report are preliminary and upon coder review may  be revised to meet current compliance requirements. Dr. Libby Maw [name] MD, MD 02/14/2023 8:44:35 AM This report has been signed electronically. Number of Addenda: 0 Note Initiated On: 02/14/2023  8:16 AM Scope Withdrawal Time: 0 hours 6 minutes 33 seconds  Total Procedure Duration: 0 hours 7 minutes 34 seconds  Estimated Blood Loss:  Estimated blood loss: none.      [facility]

## 2023-02-14 NOTE — Anesthesia Postprocedure Evaluation (Signed)
Anesthesia Post Note  Patient: Angie Schmidt  Procedure(s) Performed: COLONOSCOPY WITH PROPOFOL ESOPHAGOGASTRODUODENOSCOPY (EGD) WITH PROPOFOL  Patient location during evaluation: PACU Anesthesia Type: General Level of consciousness: awake and alert Pain management: pain level controlled Vital Signs Assessment: post-procedure vital signs reviewed and stable Respiratory status: spontaneous breathing, nonlabored ventilation, respiratory function stable and patient connected to nasal cannula oxygen Cardiovascular status: blood pressure returned to baseline and stable Postop Assessment: no apparent nausea or vomiting Anesthetic complications: no   No notable events documented.   Last Vitals:  Vitals:   02/14/23 0848 02/14/23 0859  BP: 93/66 114/71  Pulse: 79 85  Resp: (!) 25 15  Temp:  (!) 36.2 C  SpO2: 98% 98%    Last Pain:  Vitals:   02/14/23 0859  TempSrc:   PainSc: 0-No pain                 [name] [name]

## 2023-02-14 NOTE — Transfer of Care (Signed)
Immediate Anesthesia Transfer of Care Note  Patient: Angie Schmidt  Procedure(s) Performed: COLONOSCOPY WITH PROPOFOL ESOPHAGOGASTRODUODENOSCOPY (EGD) WITH PROPOFOL  Patient Location: PACU  Anesthesia Type:General  Level of Consciousness: sedated  Airway & Oxygen Therapy: Patient Spontanous Breathing  Post-op Assessment: Report given to RN  Post vital signs: Reviewed  Last Vitals:  Vitals Value Taken Time  BP 75/54 02/14/23 0846  Temp 36.2 C 02/14/23 0846  Pulse 79 02/14/23 0848  Resp 25 02/14/23 0848  SpO2 98 % 02/14/23 0848  Vitals shown include unvalidated device data. IVFs opened up wide open . Pt will open eyes to touch and name call. RN will reccheck BP  Tberg position for now Last Pain:  Vitals:   02/14/23 0846  TempSrc:   PainSc: Asleep         Complications: No notable events documented.

## 2023-02-14 NOTE — Op Note (Signed)
[facility] Patient Name: Angie Schmidt Procedure Date: 02/14/2023 8:17 AM MRN: [id number] Account #: [id number] Date of Birth: 1986/06/29 Admit Type: Outpatient Age: 37 Room: [facility] OR ROOM 01 Gender: Female Note Status: Finalized Instrument Name: [id number] Procedure:             Upper GI endoscopy Indications:           Unexplained iron deficiency anemia, Diarrhea Providers:             [name] MD, MD Referring MD:          [facility] Medicines:             General Anesthesia Complications:         No immediate complications. Estimated blood loss: None. Procedure:             Pre-Anesthesia Assessment:                        - Prior to the procedure, a History and Physical was                         performed, and patient medications and allergies were                         reviewed. The patient is competent. The risks and                         benefits of the procedure and the sedation options and                         risks were discussed with the patient. All questions                         were answered and informed consent was obtained.                         Patient identification and proposed procedure were                         verified by the physician, the nurse, the                         anesthesiologist, the anesthetist and the technician                         in the pre-procedure area in the procedure room in the                         endoscopy suite. Mental Status Examination: alert and                         oriented. Airway Examination: normal oropharyngeal                         airway and neck mobility. Respiratory Examination:                         clear to auscultation. CV Examination: normal.  Prophylactic Antibiotics: The patient does not require                         prophylactic antibiotics. Prior Anticoagulants: The                         patient has taken  no anticoagulant or antiplatelet                         agents. ASA Grade Assessment: III - A patient with                         severe systemic disease. After reviewing the risks and                         benefits, the patient was deemed in satisfactory                         condition to undergo the procedure. The anesthesia                         plan was to use general anesthesia. Immediately prior                         to administration of medications, the patient was                         re-assessed for adequacy to receive sedatives. The                         heart rate, respiratory rate, oxygen saturations,                         blood pressure, adequacy of pulmonary ventilation, and                         response to care were monitored throughout the                         procedure. The physical status of the patient was                         re-assessed after the procedure.                        After obtaining informed consent, the endoscope was                         passed under direct vision. Throughout the procedure,                         the patient's blood pressure, pulse, and oxygen                         saturations were monitored continuously. The was                         introduced through the mouth, and advanced to the  second part of duodenum. The upper GI endoscopy was                         accomplished without difficulty. The patient tolerated                         the procedure well. Findings:      The duodenal bulb and second portion of the duodenum were normal.       Biopsies were taken with a cold forceps for histology.      The entire examined stomach was normal. Biopsies were taken with a cold       forceps for histology.      The cardia and gastric fundus were normal on retroflexion.      Esophagogastric landmarks were identified: the gastroesophageal junction       was found at 35 cm from the  incisors.      The gastroesophageal junction and examined esophagus were normal. Impression:            - Normal duodenal bulb and second portion of the                         duodenum. Biopsied.                        - Normal stomach. Biopsied.                        - Esophagogastric landmarks identified.                        - Normal gastroesophageal junction and esophagus. Recommendation:        - Await pathology results.                        - Proceed with colonoscopy as scheduled                        See colonoscopy report Procedure Code(s):     --- Professional ---                        [phone], Esophagogastroduodenoscopy, flexible,                         transoral; with biopsy, single or multiple Diagnosis Code(s):     --- Professional ---                        D50.9, Iron deficiency anemia, unspecified                        R19.7, Diarrhea, unspecified CPT copyright 2022 American Medical Association. All rights reserved. The codes documented in this report are preliminary and upon coder review may  be revised to meet current compliance requirements. Dr. Libby Maw [name] MD, MD 02/14/2023 8:33:49 AM This report has been signed electronically. Number of Addenda: 0 Note Initiated On: 02/14/2023 8:17 AM Total Procedure Duration: 0 hours 6 minutes 27 seconds  Estimated Blood Loss:  Estimated blood loss: none.      [facility]

## 2023-02-14 NOTE — H&P (Signed)
Arlyss Repress, MD [address]  [address]  [place], [place] [address]  Main: [phone]  Fax: [phone] Pager: [phone]  Primary Care Physician:  Pediatrics, [facility] G[facility]ologist:  Dr. Arlyss Repress  Pre-Procedure History & Physical: HPI:  Angie Schmidt is a 37 y.o. female is here for an endoscopy and colonoscopy.   Past Medical History:  Diagnosis Date   Anemia    Anxiety    NO MEDS   Asthma    Cholestasis during pregnancy in third trimester 11/05/2018   GERD (gastroesophageal reflux disease)    NO MEDS   Headache    FREQUENT HA'S   Motion sickness    Seizure (HCC)    LAST 2018   Vaginal Pap smear, abnormal     Past Surgical History:  Procedure Laterality Date   denies surgical history     NO PAST SURGERIES      Prior to Admission medications   Medication Sig Start Date End Date Taking? Authorizing Provider  ibuprofen (ADVIL) 200 MG tablet Take 200 mg by mouth every 6 (six) hours as needed.    [provider]  omeprazole (PRILOSEC) 40 MG capsule Take 40 mg by mouth daily. 12/06/22   [provider]  VENTOLIN HFA 108 (90 Base) MCG/ACT inhaler Inhale 2 puffs into the lungs every 6 (six) hours as needed. 12/06/22   [provider]    Allergies as of 01/17/2023   (No Known Allergies)    Family History  Problem Relation Age of Onset   Diabetes Paternal Grandfather    Diabetes Paternal Grandmother    Cancer Maternal Grandmother    Diabetes Maternal Grandmother    Lung disease Maternal Grandmother    Diabetes Maternal Grandfather    Hypertension Maternal Grandfather    Diabetes Father    Cancer Mother    Diabetes Mother    Hypertension Mother    Seizures Mother    Liver disease Mother    Clotting disorder Mother     Social History   Socioeconomic History   Marital status: Single    Spouse name: Not on file   Number of children: 4   Years of education: 8   Highest education  level: 8th grade  Occupational History   Not on file  Tobacco Use   Smoking status: Former    Packs/day: 0.25    Years: 17.00    Additional pack years: 0.00    Total pack years: 4.25    Types: Cigarettes    Quit date: 09/10/2017    Years since quitting: 5.4    Passive exposure: Never   Smokeless tobacco: Never  Vaping Use   Vaping Use: Never used  Substance and Sexual Activity   Alcohol use: Yes    Alcohol/week: 3.0 standard drinks of alcohol    Types: 3 Glasses of wine per week    Comment: occasionally   Drug use: Yes    Types: Marijuana    Comment: last use 04/25/21   Sexual activity: Yes    Partners: Male    Birth control/protection: None  Other Topics Concern   Not on file  Social History Narrative   Not on file   Social Determinants of Health   Financial Resource Strain: Not on file  Food Insecurity: Not on file  Transportation Needs: Not on file  Physical Activity: Not on file  Stress: Not on file  Social Connections: Not on file  Intimate Partner Violence: Not  At Risk (04/26/2021)   Humiliation, Afraid, Rape, and Kick questionnaire    Fear of Current or Ex-Partner: No    Emotionally Abused: No    Physically Abused: No    Sexually Abused: No    Review of Systems: See HPI, otherwise negative ROS  Physical Exam: BP 116/64   Temp 98.1 F (36.7 C) (Temporal)   Ht 5' (1.524 m)   Wt 80.5 kg   LMP 01/28/2023 (Approximate) Comment: upreg negative  SpO2 99%   BMI 34.67 kg/m  General:   Alert,  pleasant and cooperative in NAD Head:  Normocephalic and atraumatic. Neck:  Supple; no masses or thyromegaly. Lungs:  Clear throughout to auscultation.    Heart:  Regular rate and rhythm. Abdomen:  Soft, nontender and nondistended. Normal bowel sounds, without guarding, and without rebound.   Neurologic:  Alert and  oriented x4;  grossly normal neurologically.  Impression/Plan: [name] is here for an endoscopy and colonoscopy to be performed for iron  deficiency anemia, chronic diarrhea   Risks, benefits, limitations, and alternatives regarding  endoscopy and colonoscopy have been reviewed with the patient.  Questions have been answered.  All parties agreeable.   Lannette Donath, MD  02/14/2023, 8:17 AM

## 2023-02-15 ENCOUNTER — Encounter: Payer: Self-pay | Admitting: Gastroenterology

## 2023-02-23 ENCOUNTER — Telehealth: Payer: Self-pay

## 2023-02-23 ENCOUNTER — Other Ambulatory Visit: Payer: Self-pay

## 2023-02-23 DIAGNOSIS — D509 Iron deficiency anemia, unspecified: Secondary | ICD-10-CM

## 2023-02-23 DIAGNOSIS — K529 Noninfective gastroenteritis and colitis, unspecified: Secondary | ICD-10-CM

## 2023-02-23 DIAGNOSIS — K900 Celiac disease: Secondary | ICD-10-CM

## 2023-02-23 NOTE — Telephone Encounter (Signed)
Called and patient verbalized understanding of results. She made follow up appointment and order lab work

## 2023-02-23 NOTE — Telephone Encounter (Signed)
-----   Message from [name], MD sent at 02/23/2023  2:19 PM EDT ----- [name]  Please inform patient that the pathology results from upper endoscopy confirms that she has celiac disease which explains iron deficiency anemia and chronic diarrhea Recommend celiac disease panel and please make a follow-up appointment to see me in the next 2 to 3 weeks to discuss about celiac disease diagnosis, okay to overbook  Recommend gluten-free diet  Rohini Vanga

## 2023-03-01 LAB — CELIAC DISEASE PANEL
Endomysial IgA: NEGATIVE
IgA/Immunoglobulin A, Serum: 458 mg/dL — ABNORMAL HIGH (ref 87–352)
Transglutaminase IgA: 5 U/mL — ABNORMAL HIGH (ref 0–3)

## 2023-03-08 ENCOUNTER — Encounter: Payer: Self-pay | Admitting: Gastroenterology

## 2023-03-08 ENCOUNTER — Ambulatory Visit: Payer: Medicaid Other | Admitting: Gastroenterology

## 2023-03-08 DIAGNOSIS — D509 Iron deficiency anemia, unspecified: Secondary | ICD-10-CM

## 2023-03-08 DIAGNOSIS — K900 Celiac disease: Secondary | ICD-10-CM

## 2023-03-08 NOTE — Telephone Encounter (Signed)
I informed patient when appointment was made that appointment was in [place] and she got notification on mychart. Angie Schmidt did not call me to ask if she could come till 1:20 and we were full so I said she would have to reschedule I offered her next Monday since you were on call and she said she only can come on Wednesdays so worked her in on 04/26/2023

## 2023-03-13 ENCOUNTER — Encounter: Payer: Self-pay | Admitting: Gastroenterology

## 2023-03-13 ENCOUNTER — Telehealth (INDEPENDENT_AMBULATORY_CARE_PROVIDER_SITE_OTHER): Payer: Medicaid Other | Admitting: Gastroenterology

## 2023-03-13 DIAGNOSIS — K900 Celiac disease: Secondary | ICD-10-CM

## 2023-03-13 NOTE — Progress Notes (Signed)
Angie Donath, MD [address]  [address]  [place], [place] [address]  Main: [phone]  Fax: [phone]    Gastroenterology Consultation Video Visit  Referring Provider:     Pediatrics, [facility] Primary Care Physician:  Pediatrics, [facility]ologist:  Dr. Arlyss Repress Reason for Consultation: Celiac disease        HPI:   Angie Schmidt is a 37 y.o. female referred by Dr. Lenore Manner, [facility]  for consultation & management of celiac disease  Virtual Visit Video Note  I connected with Angie Schmidt on 03/13/23 at  1:15 PM EDT by video and verified that I am speaking with the correct person using two identifiers.   I discussed the limitations, risks, security and privacy concerns of performing an evaluation and management service by video and the availability of in person appointments. I also discussed with the patient that there may be a patient responsible charge related to this service. The patient expressed understanding and agreed to proceed.  Location of the Patient: Outside  Location of the provider: Office  Persons participating in the visit: Patient and provider only   History of Present Illness: Angie Schmidt is a 37 year old female with new diagnosis of celiac disease.  She made a virtual follow-up appointment to discuss about the diagnosis and management of celiac disease.  I have personally seen her on 4/16 for management of iron deficiency anemia and chronic diarrhea.  She underwent upper endoscopy which revealed prominence of intraepithelial lymphocytes and partial subtotal villous blunting.  Her TTG IgA levels were moderately elevated.  Patient's anemia responded to parenteral iron therapy.  She did start a gluten-free diet on Tuesday last week. She does report family history of lupus in her mother and hypothyroidism in her other family member   NSAIDs: None  Antiplts/Anticoagulants/Anti  thrombotics: None  GI Procedures: EGD 02/14/2023  Past Medical History:  Diagnosis Date   Anemia    Anxiety    NO MEDS   Asthma    Cholestasis during pregnancy in third trimester 11/05/2018   GERD (gastroesophageal reflux disease)    NO MEDS   Headache    FREQUENT HA'S   Motion sickness    Seizure (HCC)    LAST 2018   Vaginal Pap smear, abnormal     Past Surgical History:  Procedure Laterality Date   COLONOSCOPY WITH PROPOFOL N/A 02/14/2023   Procedure: COLONOSCOPY WITH PROPOFOL;  Surgeon: [name], MD;  Location: [facility];  Service: Endoscopy;  Laterality: N/A;   denies surgical history     ESOPHAGOGASTRODUODENOSCOPY (EGD) WITH PROPOFOL N/A 02/14/2023   Procedure: ESOPHAGOGASTRODUODENOSCOPY (EGD) WITH PROPOFOL;  Surgeon: [name], MD;  Location: [facility];  Service: Endoscopy;  Laterality: N/A;   NO PAST SURGERIES       Current Outpatient Medications:    ibuprofen (ADVIL) 200 MG tablet, Take 200 mg by mouth every 6 (six) hours as needed., Disp: , Rfl:    omeprazole (PRILOSEC) 40 MG capsule, Take 40 mg by mouth daily., Disp: , Rfl:    VENTOLIN HFA 108 (90 Base) MCG/ACT inhaler, Inhale 2 puffs into the lungs every 6 (six) hours as needed., Disp: , Rfl:    Family History  Problem Relation Age of Onset   Diabetes Paternal Grandfather    Diabetes Paternal Grandmother    Cancer Maternal Grandmother    Diabetes Maternal Grandmother    Lung disease Maternal Grandmother  Diabetes Maternal Grandfather    Hypertension Maternal Grandfather    Diabetes Father    Cancer Mother    Diabetes Mother    Hypertension Mother    Seizures Mother    Liver disease Mother    Clotting disorder Mother      Social History   Tobacco Use   Smoking status: Former    Packs/day: 0.25    Years: 17.00    Additional pack years: 0.00    Total pack years: 4.25    Types: Cigarettes    Quit date: 09/10/2017    Years since quitting: 5.5    Passive  exposure: Never   Smokeless tobacco: Never  Vaping Use   Vaping Use: Never used  Substance Use Topics   Alcohol use: Yes    Alcohol/week: 3.0 standard drinks of alcohol    Types: 3 Glasses of wine per week    Comment: occasionally   Drug use: Yes    Types: Marijuana    Comment: last use 04/25/21    Allergies as of 03/13/2023   (No Known Allergies)     Imaging Studies: No abdominal imaging  Assessment and Plan:   Angie Schmidt is a 37 y.o. female with history of iron deficiency anemia, chronic diarrhea found to have elevated serum TTG IgA levels as well as duodenal biopsies confirmed celiac disease  Celiac disease Discussed in length regarding pathophysiology of celiac disease, importance of adherence to strict gluten-free diet which will help to normalize TTG levels as well as resolve villous blunting and inflammation and improve absorption Sent her information about gluten-free diet Recheck celiac serologies in 3 months She will need upper endoscopy in 6 months    Follow Up Instructions:   I discussed the assessment and treatment plan with the patient. The patient was provided an opportunity to ask questions and all were answered. The patient agreed with the plan and demonstrated an understanding of the instructions.   The patient was advised to call back or seek an in-person evaluation if the symptoms worsen or if the condition fails to improve as anticipated.  I provided 15 minutes of face-to-face time during this encounter.   Follow up in 3 to 6 months   Arlyss Repress, MD

## 2023-03-13 NOTE — Telephone Encounter (Signed)
Called and informed patient and she will have vitamin d levels done and made follow up appointment

## 2023-03-13 NOTE — Telephone Encounter (Signed)
Does she need appointment that is schedule with Angie Schmidt on 07/24

## 2023-03-22 DIAGNOSIS — K900 Celiac disease: Secondary | ICD-10-CM | POA: Diagnosis not present

## 2023-03-22 DIAGNOSIS — D509 Iron deficiency anemia, unspecified: Secondary | ICD-10-CM | POA: Diagnosis not present

## 2023-03-23 LAB — VITAMIN D 25 HYDROXY (VIT D DEFICIENCY, FRACTURES): Vit D, 25-Hydroxy: 19.5 ng/mL — ABNORMAL LOW (ref 30.0–100.0)

## 2023-04-11 DIAGNOSIS — K900 Celiac disease: Secondary | ICD-10-CM | POA: Diagnosis not present

## 2023-04-11 DIAGNOSIS — M009 Pyogenic arthritis, unspecified: Secondary | ICD-10-CM | POA: Diagnosis not present

## 2023-04-11 DIAGNOSIS — M19071 Primary osteoarthritis, right ankle and foot: Secondary | ICD-10-CM | POA: Diagnosis not present

## 2023-04-11 DIAGNOSIS — M7712 Lateral epicondylitis, left elbow: Secondary | ICD-10-CM | POA: Diagnosis not present

## 2023-04-13 ENCOUNTER — Encounter: Payer: Self-pay | Admitting: Internal Medicine

## 2023-04-26 ENCOUNTER — Ambulatory Visit: Payer: Medicaid Other | Admitting: Gastroenterology

## 2023-05-02 ENCOUNTER — Ambulatory Visit (INDEPENDENT_AMBULATORY_CARE_PROVIDER_SITE_OTHER): Payer: Medicaid Other | Admitting: Podiatry

## 2023-05-02 DIAGNOSIS — M7751 Other enthesopathy of right foot: Secondary | ICD-10-CM | POA: Diagnosis not present

## 2023-05-02 DIAGNOSIS — F331 Major depressive disorder, recurrent, moderate: Secondary | ICD-10-CM | POA: Diagnosis not present

## 2023-05-02 NOTE — Progress Notes (Signed)
  Subjective:  Patient ID: Angie Schmidt, female    DOB: 10-06-1985,  MRN: [id number]  Chief Complaint  Patient presents with   Foot Pain    Pt stated that she has foot pain worse in the morning right foot is worse than left at times     37 y.o. female presents with the above complaint.  Patient presents with primary complaint of right fourth metatarsophalangeal joint.  Patient states pain for touch is progressive gotten worse worse with ambulation worse with pressure mostly in the ball of the foot.  She is compensating more on the left side and this causes start to cause her some pain.  She denies any other acute complaints pain scale 7 out of 10 dull achy in nature.   Review of Systems: Negative except as noted in the HPI. Denies N/V/F/Ch.  Past Medical History:  Diagnosis Date   Anemia    Anxiety    NO MEDS   Asthma    Cholestasis during pregnancy in third trimester 11/05/2018   GERD (gastroesophageal reflux disease)    NO MEDS   Headache    FREQUENT HA'S   Motion sickness    Seizure (HCC)    LAST 2018   Vaginal Pap smear, abnormal     Current Outpatient Medications:    ibuprofen (ADVIL) 200 MG tablet, Take 200 mg by mouth every 6 (six) hours as needed., Disp: , Rfl:    omeprazole (PRILOSEC) 40 MG capsule, Take 40 mg by mouth daily., Disp: , Rfl:    VENTOLIN HFA 108 (90 Base) MCG/ACT inhaler, Inhale 2 puffs into the lungs every 6 (six) hours as needed., Disp: , Rfl:   Social History   Tobacco Use  Smoking Status Former   Current packs/day: 0.00   Average packs/day: 0.3 packs/day for 17.0 years (4.3 ttl pk-yrs)   Types: Cigarettes   Start date: 09/10/2000   Quit date: 09/10/2017   Years since quitting: 5.6   Passive exposure: Never  Smokeless Tobacco Never    No Known Allergies Objective:  There were no vitals filed for this visit. There is no height or weight on file to calculate BMI. Constitutional Well developed. Well nourished.  Vascular Dorsalis pedis  pulses palpable bilaterally. Posterior tibial pulses palpable bilaterally. Capillary refill normal to all digits.  No cyanosis or clubbing noted. Pedal hair growth normal.  Neurologic Normal speech. Oriented to person, place, and time. Epicritic sensation to light touch grossly present bilaterally.  Dermatologic Nails well groomed and normal in appearance. No open wounds. No skin lesions.  Orthopedic: Pain on palpation of right fourth metatarsophalangeal joint pain with range of motion of the joint no pain with resisted dorsiflexion and resisted plantarflexion ruling out tendinitis.  No deep intra-articular pain noted   Radiographs: None Assessment:   1. Capsulitis of metatarsophalangeal (MTP) joint of right foot    Plan:  Patient was evaluated and treated and all questions answered.  Right fourth metatarsophalangeal joint capsulitis -All questions or concerns were discussed with the patient in extensive detail given the amount of pain that she is experiencing she will benefit from a steroid injection of decrease acute inflammatory component associate with pain.  Patient agrees with plan like to proceed with steroid injection -A steroid injection was performed at right fourth metatarsophalangeal joint using 1% plain Lidocaine and 10 mg of Kenalog. This was well tolerated.   No follow-ups on file.

## 2023-05-03 DIAGNOSIS — F331 Major depressive disorder, recurrent, moderate: Secondary | ICD-10-CM | POA: Diagnosis not present

## 2023-05-17 DIAGNOSIS — F331 Major depressive disorder, recurrent, moderate: Secondary | ICD-10-CM | POA: Diagnosis not present

## 2023-05-31 DIAGNOSIS — F331 Major depressive disorder, recurrent, moderate: Secondary | ICD-10-CM | POA: Diagnosis not present

## 2023-06-06 ENCOUNTER — Telehealth: Payer: Self-pay | Admitting: Internal Medicine

## 2023-06-06 NOTE — Telephone Encounter (Signed)
This patient called to schedule missed B12 injections. Please advise on how to schedule. Thanks!

## 2023-06-09 ENCOUNTER — Inpatient Hospital Stay: Payer: Medicaid Other

## 2023-06-13 ENCOUNTER — Inpatient Hospital Stay: Payer: Medicaid Other | Attending: Internal Medicine

## 2023-06-13 DIAGNOSIS — E538 Deficiency of other specified B group vitamins: Secondary | ICD-10-CM | POA: Diagnosis not present

## 2023-06-13 DIAGNOSIS — D500 Iron deficiency anemia secondary to blood loss (chronic): Secondary | ICD-10-CM

## 2023-06-13 MED ORDER — CYANOCOBALAMIN 1000 MCG/ML IJ SOLN
1000.0000 ug | Freq: Once | INTRAMUSCULAR | Status: AC
  Administered 2023-06-13: 1000 ug via INTRAMUSCULAR
  Filled 2023-06-13: qty 1

## 2023-06-14 DIAGNOSIS — F331 Major depressive disorder, recurrent, moderate: Secondary | ICD-10-CM | POA: Diagnosis not present

## 2023-06-16 ENCOUNTER — Inpatient Hospital Stay: Payer: Medicaid Other

## 2023-06-20 ENCOUNTER — Inpatient Hospital Stay: Payer: Medicaid Other

## 2023-06-20 DIAGNOSIS — D500 Iron deficiency anemia secondary to blood loss (chronic): Secondary | ICD-10-CM

## 2023-06-20 DIAGNOSIS — E538 Deficiency of other specified B group vitamins: Secondary | ICD-10-CM | POA: Diagnosis not present

## 2023-06-20 MED ORDER — CYANOCOBALAMIN 1000 MCG/ML IJ SOLN
1000.0000 ug | Freq: Once | INTRAMUSCULAR | Status: AC
  Administered 2023-06-20: 1000 ug via INTRAMUSCULAR
  Filled 2023-06-20: qty 1

## 2023-06-21 ENCOUNTER — Ambulatory Visit: Payer: Medicaid Other | Admitting: Gastroenterology

## 2023-06-21 ENCOUNTER — Encounter: Payer: Self-pay | Admitting: Gastroenterology

## 2023-06-23 ENCOUNTER — Inpatient Hospital Stay: Payer: Medicaid Other

## 2023-06-27 ENCOUNTER — Inpatient Hospital Stay: Payer: Medicaid Other

## 2023-06-28 ENCOUNTER — Inpatient Hospital Stay: Payer: Medicaid Other

## 2023-06-28 DIAGNOSIS — E538 Deficiency of other specified B group vitamins: Secondary | ICD-10-CM | POA: Diagnosis not present

## 2023-06-28 DIAGNOSIS — D500 Iron deficiency anemia secondary to blood loss (chronic): Secondary | ICD-10-CM

## 2023-06-28 DIAGNOSIS — F331 Major depressive disorder, recurrent, moderate: Secondary | ICD-10-CM | POA: Diagnosis not present

## 2023-06-28 MED ORDER — METHYLPREDNISOLONE SODIUM SUCC 125 MG IJ SOLR: 125.0000 mg | Freq: Once | INTRAMUSCULAR | Status: DC | PRN

## 2023-06-28 MED ORDER — EPINEPHRINE 0.3 MG/0.3ML IJ SOAJ: 0.3000 mg | Freq: Once | INTRAMUSCULAR | Status: DC | PRN

## 2023-06-28 MED ORDER — DIPHENHYDRAMINE HCL 50 MG/ML IJ SOLN: 50.0000 mg | Freq: Once | INTRAMUSCULAR | Status: DC | PRN

## 2023-06-28 MED ORDER — SODIUM CHLORIDE 0.9% FLUSH
3.0000 mL | Freq: Once | INTRAVENOUS | Status: DC | PRN
  Filled 2023-06-28: qty 3

## 2023-06-28 MED ORDER — CYANOCOBALAMIN 1000 MCG/ML IJ SOLN
1000.0000 ug | Freq: Once | INTRAMUSCULAR | Status: AC
  Administered 2023-06-28: 1000 ug via INTRAMUSCULAR
  Filled 2023-06-28: qty 1

## 2023-06-28 MED ORDER — ALTEPLASE 2 MG IJ SOLR
2.0000 mg | Freq: Once | INTRAMUSCULAR | Status: DC | PRN
  Filled 2023-06-28: qty 2

## 2023-06-28 MED ORDER — SODIUM CHLORIDE 0.9% FLUSH
10.0000 mL | Freq: Once | INTRAVENOUS | Status: DC | PRN
  Filled 2023-06-28: qty 10

## 2023-06-28 MED ORDER — ALBUTEROL SULFATE HFA 108 (90 BASE) MCG/ACT IN AERS: 2.0000 | INHALATION_SPRAY | Freq: Once | RESPIRATORY_TRACT | Status: DC | PRN

## 2023-06-28 MED ORDER — HEPARIN SOD (PORK) LOCK FLUSH 100 UNIT/ML IV SOLN
250.0000 [IU] | Freq: Once | INTRAVENOUS | Status: DC | PRN
  Filled 2023-06-28: qty 5

## 2023-06-28 MED ORDER — SODIUM CHLORIDE 0.9 % IV SOLN
Freq: Once | INTRAVENOUS | Status: DC | PRN
  Filled 2023-06-28: qty 250

## 2023-06-28 MED ORDER — FAMOTIDINE IN NACL 20-0.9 MG/50ML-% IV SOLN: 20.0000 mg | Freq: Once | INTRAVENOUS | Status: DC | PRN

## 2023-06-28 MED ORDER — HEPARIN SOD (PORK) LOCK FLUSH 100 UNIT/ML IV SOLN
500.0000 [IU] | Freq: Once | INTRAVENOUS | Status: DC | PRN
  Filled 2023-06-28: qty 5

## 2023-06-30 ENCOUNTER — Inpatient Hospital Stay: Payer: Medicaid Other

## 2023-07-04 ENCOUNTER — Inpatient Hospital Stay: Payer: Medicaid Other | Attending: Internal Medicine

## 2023-07-04 DIAGNOSIS — E538 Deficiency of other specified B group vitamins: Secondary | ICD-10-CM | POA: Diagnosis not present

## 2023-07-04 DIAGNOSIS — D500 Iron deficiency anemia secondary to blood loss (chronic): Secondary | ICD-10-CM

## 2023-07-04 MED ORDER — CYANOCOBALAMIN 1000 MCG/ML IJ SOLN
1000.0000 ug | Freq: Once | INTRAMUSCULAR | Status: AC
  Administered 2023-07-04: 1000 ug via INTRAMUSCULAR
  Filled 2023-07-04: qty 1

## 2023-07-12 DIAGNOSIS — F331 Major depressive disorder, recurrent, moderate: Secondary | ICD-10-CM | POA: Diagnosis not present

## 2023-07-26 DIAGNOSIS — F331 Major depressive disorder, recurrent, moderate: Secondary | ICD-10-CM | POA: Diagnosis not present

## 2023-08-09 DIAGNOSIS — F331 Major depressive disorder, recurrent, moderate: Secondary | ICD-10-CM | POA: Diagnosis not present

## 2023-08-15 ENCOUNTER — Inpatient Hospital Stay: Payer: Medicaid Other | Attending: Internal Medicine | Admitting: Internal Medicine

## 2023-08-15 ENCOUNTER — Inpatient Hospital Stay: Payer: Medicaid Other

## 2023-08-15 VITALS — BP 109/61 | HR 84 | Temp 98.2°F | Wt 182.0 lb

## 2023-08-15 DIAGNOSIS — D508 Other iron deficiency anemias: Secondary | ICD-10-CM | POA: Diagnosis not present

## 2023-08-15 DIAGNOSIS — K529 Noninfective gastroenteritis and colitis, unspecified: Secondary | ICD-10-CM | POA: Insufficient documentation

## 2023-08-15 DIAGNOSIS — E538 Deficiency of other specified B group vitamins: Secondary | ICD-10-CM

## 2023-08-15 DIAGNOSIS — D500 Iron deficiency anemia secondary to blood loss (chronic): Secondary | ICD-10-CM

## 2023-08-15 DIAGNOSIS — Z87891 Personal history of nicotine dependence: Secondary | ICD-10-CM | POA: Insufficient documentation

## 2023-08-15 DIAGNOSIS — Z800 Family history of malignant neoplasm of digestive organs: Secondary | ICD-10-CM | POA: Diagnosis not present

## 2023-08-15 DIAGNOSIS — K900 Celiac disease: Secondary | ICD-10-CM | POA: Diagnosis not present

## 2023-08-15 LAB — IRON AND TIBC
Iron: 40 ug/dL (ref 28–170)
Saturation Ratios: 11 % (ref 10.4–31.8)
TIBC: 357 ug/dL (ref 250–450)
UIBC: 317 ug/dL

## 2023-08-15 LAB — CBC WITH DIFFERENTIAL/PLATELET
Abs Immature Granulocytes: 0.02 K/uL (ref 0.00–0.07)
Basophils Absolute: 0 K/uL (ref 0.0–0.1)
Basophils Relative: 0 %
Eosinophils Absolute: 0.1 K/uL (ref 0.0–0.5)
Eosinophils Relative: 1 %
HCT: 36.8 % (ref 36.0–46.0)
Hemoglobin: 12.1 g/dL (ref 12.0–15.0)
Immature Granulocytes: 0 %
Lymphocytes Relative: 29 %
Lymphs Abs: 2 K/uL (ref 0.7–4.0)
MCH: 27.4 pg (ref 26.0–34.0)
MCHC: 32.9 g/dL (ref 30.0–36.0)
MCV: 83.3 fL (ref 80.0–100.0)
Monocytes Absolute: 0.4 K/uL (ref 0.1–1.0)
Monocytes Relative: 5 %
Neutro Abs: 4.4 K/uL (ref 1.7–7.7)
Neutrophils Relative %: 65 %
Platelets: 367 K/uL (ref 150–400)
RBC: 4.42 MIL/uL (ref 3.87–5.11)
RDW: 13.4 % (ref 11.5–15.5)
WBC: 6.9 K/uL (ref 4.0–10.5)
nRBC: 0 % (ref 0.0–0.2)

## 2023-08-15 LAB — VITAMIN B12: Vitamin B-12: 324 pg/mL (ref 180–914)

## 2023-08-15 LAB — FERRITIN: Ferritin: 19 ng/mL (ref 11–307)

## 2023-08-15 NOTE — Progress Notes (Signed)
[facility]  [phone] [phone]  ID: Angie Schmidt OB: 08-Jul-1986  MR#: [id number]  AOZ#:[id number]  Patient Care Team: Alease Medina, MD as PCP - General (Family Medicine) Michaelyn Barter, MD as Consulting Physician (Oncology)   HPI: Angie Schmidt is a 37 y.o. female with past medical history of anemia, GERD was referred to hematology for further management of iron deficiency anemia and vitamin B12 deficiency.  Upper endoscopy and colonoscopy from 02/14/2023 by Dr. Allegra Lai was normal.  Pathology from duodenal biopsy showed prominence of intraepithelial lymphocytes and partial to subtotal villous blunting.  Could be seen in celiac disease.  History of mother with colon cancer at age 26.    Completed IV Feraheme weekly x 2 doses in October 2023.  Vitamin B12 injection weekly x 4 in October 2024  Interval history Patient seen today as follow-up for IDA, vitamin B12 deficiency secondary to celiac disease, labs. Reports an episode of dizziness while she was at work.  Still feeling dizzy today.  Had also had chest pain which is little better.  But not gone.  Denies any recent illnesses.   REVIEW OF SYSTEMS:   Review of Systems  Neurological:  Positive for dizziness.    As per HPI. Otherwise, a complete review of systems is negative.  PAST MEDICAL HISTORY: Past Medical History:  Diagnosis Date   Anemia    Anxiety    NO MEDS   Asthma    Cholestasis during pregnancy in third trimester 11/05/2018   GERD (gastroesophageal reflux disease)    NO MEDS   Headache    FREQUENT HA'S   Motion sickness    Seizure (HCC)    LAST 2018   Vaginal Pap smear, abnormal     PAST SURGICAL HISTORY: Past Surgical History:  Procedure Laterality Date   COLONOSCOPY WITH PROPOFOL N/A 02/14/2023   Procedure: COLONOSCOPY WITH PROPOFOL;  Surgeon: [name], MD;  Location: [facility];  Service: Endoscopy;  Laterality: N/A;   denies  surgical history     ESOPHAGOGASTRODUODENOSCOPY (EGD) WITH PROPOFOL N/A 02/14/2023   Procedure: ESOPHAGOGASTRODUODENOSCOPY (EGD) WITH PROPOFOL;  Surgeon: [name], MD;  Location: [facility];  Service: Endoscopy;  Laterality: N/A;   NO PAST SURGERIES      FAMILY HISTORY: Family History  Problem Relation Age of Onset   Diabetes Paternal Grandfather    Diabetes Paternal Grandmother    Cancer Maternal Grandmother    Diabetes Maternal Grandmother    Lung disease Maternal Grandmother    Diabetes Maternal Grandfather    Hypertension Maternal Grandfather    Diabetes Father    Cancer Mother    Diabetes Mother    Hypertension Mother    Seizures Mother    Liver disease Mother    Clotting disorder Mother     HEALTH MAINTENANCE: Social History   Tobacco Use   Smoking status: Former    Current packs/day: 0.00    Average packs/day: 0.3 packs/day for 17.0 years (4.3 ttl pk-yrs)    Types: Cigarettes    Start date: 09/10/2000    Quit date: 09/10/2017    Years since quitting: 5.9    Passive exposure: Never   Smokeless tobacco: Never  Vaping Use   Vaping status: Never Used  Substance Use Topics   Alcohol use: Yes    Alcohol/week: 3.0 standard drinks of alcohol    Types: 3 Glasses of wine per week    Comment: occasionally   Drug use: Yes  Types: Marijuana    Comment: last use 04/25/21     No Known Allergies  Current Outpatient Medications  Medication Sig Dispense Refill   ibuprofen (ADVIL) 200 MG tablet Take 200 mg by mouth every 6 (six) hours as needed.     omeprazole (PRILOSEC) 40 MG capsule Take 40 mg by mouth daily.     VENTOLIN HFA 108 (90 Base) MCG/ACT inhaler Inhale 2 puffs into the lungs every 6 (six) hours as needed.     No current facility-administered medications for this visit.    OBJECTIVE: Vitals:   08/15/23 1005  BP: 109/61  Pulse: 84  Temp: 98.2 F (36.8 C)  SpO2: 100%     Body mass index is 35.54 kg/m.      General:  Well-developed, well-nourished, no acute distress. Eyes: Pink conjunctiva, anicteric sclera. HEENT: Normocephalic, moist mucous membranes, clear oropharnyx. Lungs: Clear to auscultation bilaterally. Heart: Regular rate and rhythm. No rubs, murmurs, or gallops. Abdomen: Soft, nontender, nondistended. No organomegaly noted, normoactive bowel sounds. Musculoskeletal: No edema, cyanosis, or clubbing. Neuro: Alert, answering all questions appropriately. Cranial nerves grossly intact. Skin: No rashes or petechiae noted. Psych: Normal affect. Lymphatics: No cervical, calvicular, axillary or inguinal LAD.   LAB RESULTS:  Lab Results  Component Value Date   NA 135 11/05/2018   K 3.4 (L) 11/05/2018   CL 109 11/05/2018   CO2 18 (L) 11/05/2018   GLUCOSE 108 (H) 11/05/2018   BUN 8 11/05/2018   CREATININE 0.54 11/05/2018   CALCIUM 8.3 (L) 11/05/2018   PROT 7.2 11/05/2018   ALBUMIN 2.9 (L) 11/05/2018   AST 25 11/05/2018   ALT 23 11/05/2018   ALKPHOS 243 (H) 11/05/2018   BILITOT 0.6 11/05/2018   GFRNONAA >60 11/05/2018   GFRAA >60 11/05/2018    Lab Results  Component Value Date   WBC 6.9 08/15/2023   NEUTROABS 4.4 08/15/2023   HGB 12.1 08/15/2023   HCT 36.8 08/15/2023   MCV 83.3 08/15/2023   PLT 367 08/15/2023    Lab Results  Component Value Date   TIBC 357 08/15/2023   TIBC 333 02/13/2023   TIBC 319 09/06/2022   FERRITIN 19 08/15/2023   FERRITIN 42 02/13/2023   FERRITIN 126 09/06/2022   IRONPCTSAT 11 08/15/2023   IRONPCTSAT 23 02/13/2023   IRONPCTSAT 22 09/06/2022     STUDIES: No results found.  ASSESSMENT AND PLAN:   Angie Schmidt is a 37 y.o. female with pmh of anemia, GERD was referred to hematology for further management of iron deficiency anemia.  # Iron deficiency anemia -Secondary to malabsorption from celiac disease. -Completed upper endoscopy and colonoscopy with Dr. Allegra Lai in May 2024. -s/p IV Feraheme weekly x 2 doses in October 2023.  Iron panel  reviewed today and has been trending down.  Ferritin is down from 42-19.  Saturation from 23 to 11%.  Will send a MyChart message to the patient and we will schedule her for IV Feraheme weekly x 2 doses.  # Vitamin B12 deficiency -s/p weekly B12 injection 1000 mcg completed 07/2023.  Now on maintenance B12 injection every 3 months. -B12 pending from today.  # Chronic diarrhea -s/p endoscopy and colonoscopy with path report consistent with celiac disease. -On gluten-free diet.  Improvement in symptoms.  # Dizziness -Unclear etiology.  Patient advised to monitor symptoms if does not resolve to discuss with PCP.  Orders Placed This Encounter  Procedures   CBC with Differential (Cancer Center Only)   Iron and TIBC  Ferritin   Vitamin B12   CBC with Differential (Cancer Center Only)   CMP (Cancer Center only)   Ferritin   Iron and TIBC    B12 injections every 3 months 4 months-Labs 8 months-MD visit, labs, B12  Patient expressed understanding and was in agreement with this plan. She also understands that She can call clinic at any time with any questions, concerns, or complaints.   I spent a total of 30 minutes reviewing chart data, face-to-face evaluation with the patient, counseling and coordination of care as detailed above.  Michaelyn Barter, MD   08/15/2023 12:23 PM

## 2023-08-15 NOTE — Progress Notes (Signed)
Patient says that since yesterday she has been feeling dizzy, at work she felt like she was going to pass out. Also she was having some chest pain, and some brain fogginess as well. Both of her hands have been itching recently.

## 2023-08-22 ENCOUNTER — Inpatient Hospital Stay: Payer: Medicaid Other

## 2023-08-22 ENCOUNTER — Encounter: Payer: Self-pay | Admitting: Internal Medicine

## 2023-08-22 VITALS — BP 108/69 | HR 71 | Temp 96.8°F | Resp 18

## 2023-08-22 DIAGNOSIS — K529 Noninfective gastroenteritis and colitis, unspecified: Secondary | ICD-10-CM | POA: Diagnosis not present

## 2023-08-22 DIAGNOSIS — K900 Celiac disease: Secondary | ICD-10-CM | POA: Diagnosis not present

## 2023-08-22 DIAGNOSIS — D500 Iron deficiency anemia secondary to blood loss (chronic): Secondary | ICD-10-CM

## 2023-08-22 DIAGNOSIS — Z800 Family history of malignant neoplasm of digestive organs: Secondary | ICD-10-CM | POA: Diagnosis not present

## 2023-08-22 DIAGNOSIS — Z87891 Personal history of nicotine dependence: Secondary | ICD-10-CM | POA: Diagnosis not present

## 2023-08-22 DIAGNOSIS — D508 Other iron deficiency anemias: Secondary | ICD-10-CM | POA: Diagnosis not present

## 2023-08-22 DIAGNOSIS — E538 Deficiency of other specified B group vitamins: Secondary | ICD-10-CM | POA: Diagnosis not present

## 2023-08-22 MED ORDER — SODIUM CHLORIDE 0.9 % IV SOLN
510.0000 mg | Freq: Once | INTRAVENOUS | Status: AC
  Administered 2023-08-22: 510 mg via INTRAVENOUS
  Filled 2023-08-22: qty 510

## 2023-08-22 MED ORDER — SODIUM CHLORIDE 0.9 % IV SOLN
INTRAVENOUS | Status: DC
  Filled 2023-08-22: qty 250

## 2023-08-22 NOTE — Addendum Note (Signed)
Addended byMichaelyn Barter on: 08/22/2023 01:59 PM   Modules accepted: Orders

## 2023-08-22 NOTE — Patient Instructions (Signed)
 Ferumoxytol Injection What is this medication? FERUMOXYTOL (FER ue MOX i tol) treats low levels of iron in your body (iron deficiency anemia). Iron is a mineral that plays an important role in making red blood cells, which carry oxygen from your lungs to the rest of your body. This medicine may be used for other purposes; ask your health care provider or pharmacist if you have questions. COMMON BRAND NAME(S): Feraheme What should I tell my care team before I take this medication? They need to know if you have any of these conditions: Anemia not caused by low iron levels High levels of iron in the blood Magnetic resonance imaging (MRI) test scheduled An unusual or allergic reaction to iron, other medications, foods, dyes, or preservatives Pregnant or trying to get pregnant Breastfeeding How should I use this medication? This medication is injected into a vein. It is given by your care team in a hospital or clinic setting. Talk to your care team the use of this medication in children. Special care may be needed. Overdosage: If you think you have taken too much of this medicine contact a poison control center or emergency room at once. NOTE: This medicine is only for you. Do not share this medicine with others. What if I miss a dose? It is important not to miss your dose. Call your care team if you are unable to keep an appointment. What may interact with this medication? Other iron products This list may not describe all possible interactions. Give your health care provider a list of all the medicines, herbs, non-prescription drugs, or dietary supplements you use. Also tell them if you smoke, drink alcohol, or use illegal drugs. Some items may interact with your medicine. What should I watch for while using this medication? Visit your care team regularly. Tell your care team if your symptoms do not start to get better or if they get worse. You may need blood work done while you are taking this  medication. You may need to follow a special diet. Talk to your care team. Foods that contain iron include: whole grains/cereals, dried fruits, beans, or peas, leafy green vegetables, and organ meats (liver, kidney). What side effects may I notice from receiving this medication? Side effects that you should report to your care team as soon as possible: Allergic reactions--skin rash, itching, hives, swelling of the face, lips, tongue, or throat Low blood pressure--dizziness, feeling faint or lightheaded, blurry vision Shortness of breath Side effects that usually do not require medical attention (report to your care team if they continue or are bothersome): Flushing Headache Joint pain Muscle pain Nausea Pain, redness, or irritation at injection site This list may not describe all possible side effects. Call your doctor for medical advice about side effects. You may report side effects to FDA at 1-800-FDA-1088. Where should I keep my medication? This medication is given in a hospital or clinic. It will not be stored at home. NOTE: This sheet is a summary. It may not cover all possible information. If you have questions about this medicine, talk to your doctor, pharmacist, or health care provider.  2024 Elsevier/Gold Standard (2023-02-24 00:00:00)

## 2023-08-23 DIAGNOSIS — F331 Major depressive disorder, recurrent, moderate: Secondary | ICD-10-CM | POA: Diagnosis not present

## 2023-08-29 ENCOUNTER — Inpatient Hospital Stay: Payer: Medicaid Other

## 2023-08-29 VITALS — BP 103/69 | HR 72 | Temp 97.9°F | Resp 19

## 2023-08-29 DIAGNOSIS — D500 Iron deficiency anemia secondary to blood loss (chronic): Secondary | ICD-10-CM

## 2023-08-29 DIAGNOSIS — Z87891 Personal history of nicotine dependence: Secondary | ICD-10-CM | POA: Diagnosis not present

## 2023-08-29 DIAGNOSIS — Z800 Family history of malignant neoplasm of digestive organs: Secondary | ICD-10-CM | POA: Diagnosis not present

## 2023-08-29 DIAGNOSIS — D508 Other iron deficiency anemias: Secondary | ICD-10-CM | POA: Diagnosis not present

## 2023-08-29 DIAGNOSIS — E538 Deficiency of other specified B group vitamins: Secondary | ICD-10-CM | POA: Diagnosis not present

## 2023-08-29 DIAGNOSIS — K900 Celiac disease: Secondary | ICD-10-CM | POA: Diagnosis not present

## 2023-08-29 DIAGNOSIS — K529 Noninfective gastroenteritis and colitis, unspecified: Secondary | ICD-10-CM | POA: Diagnosis not present

## 2023-08-29 MED ORDER — SODIUM CHLORIDE 0.9 % IV SOLN
INTRAVENOUS | Status: DC
  Filled 2023-08-29: qty 250

## 2023-08-29 MED ORDER — FERUMOXYTOL INJECTION 510 MG/17 ML
510.0000 mg | Freq: Once | INTRAVENOUS | Status: AC
  Administered 2023-08-29: 510 mg via INTRAVENOUS
  Filled 2023-08-29: qty 510

## 2023-09-06 DIAGNOSIS — F331 Major depressive disorder, recurrent, moderate: Secondary | ICD-10-CM | POA: Diagnosis not present

## 2023-09-20 DIAGNOSIS — F331 Major depressive disorder, recurrent, moderate: Secondary | ICD-10-CM | POA: Diagnosis not present

## 2023-10-10 ENCOUNTER — Inpatient Hospital Stay: Payer: Medicaid Other | Attending: Internal Medicine

## 2023-10-10 DIAGNOSIS — E538 Deficiency of other specified B group vitamins: Secondary | ICD-10-CM | POA: Insufficient documentation

## 2023-10-10 DIAGNOSIS — D500 Iron deficiency anemia secondary to blood loss (chronic): Secondary | ICD-10-CM

## 2023-10-10 MED ORDER — CYANOCOBALAMIN 1000 MCG/ML IJ SOLN
1000.0000 ug | Freq: Once | INTRAMUSCULAR | Status: AC
  Administered 2023-10-10: 1000 ug via INTRAMUSCULAR
  Filled 2023-10-10: qty 1

## 2023-10-11 DIAGNOSIS — F331 Major depressive disorder, recurrent, moderate: Secondary | ICD-10-CM | POA: Diagnosis not present

## 2023-10-25 DIAGNOSIS — F331 Major depressive disorder, recurrent, moderate: Secondary | ICD-10-CM | POA: Diagnosis not present

## 2023-11-08 DIAGNOSIS — F331 Major depressive disorder, recurrent, moderate: Secondary | ICD-10-CM | POA: Diagnosis not present

## 2023-11-14 DIAGNOSIS — H52223 Regular astigmatism, bilateral: Secondary | ICD-10-CM | POA: Diagnosis not present

## 2023-11-14 DIAGNOSIS — H5213 Myopia, bilateral: Secondary | ICD-10-CM | POA: Diagnosis not present

## 2023-11-22 DIAGNOSIS — F331 Major depressive disorder, recurrent, moderate: Secondary | ICD-10-CM | POA: Diagnosis not present

## 2023-12-05 ENCOUNTER — Ambulatory Visit

## 2023-12-05 ENCOUNTER — Telehealth: Payer: Medicaid Other | Admitting: Family

## 2023-12-05 ENCOUNTER — Other Ambulatory Visit (INDEPENDENT_AMBULATORY_CARE_PROVIDER_SITE_OTHER): Payer: Self-pay | Admitting: Family

## 2023-12-05 ENCOUNTER — Encounter: Payer: Self-pay | Admitting: *Deleted

## 2023-12-05 VITALS — BP 102/62 | HR 73 | Temp 98.0°F | Ht 60.0 in | Wt 185.0 lb

## 2023-12-05 DIAGNOSIS — E669 Obesity, unspecified: Secondary | ICD-10-CM

## 2023-12-05 DIAGNOSIS — K900 Celiac disease: Secondary | ICD-10-CM | POA: Diagnosis not present

## 2023-12-05 DIAGNOSIS — R7989 Other specified abnormal findings of blood chemistry: Secondary | ICD-10-CM

## 2023-12-05 DIAGNOSIS — R21 Rash and other nonspecific skin eruption: Secondary | ICD-10-CM | POA: Insufficient documentation

## 2023-12-05 DIAGNOSIS — Z833 Family history of diabetes mellitus: Secondary | ICD-10-CM | POA: Diagnosis not present

## 2023-12-05 DIAGNOSIS — D500 Iron deficiency anemia secondary to blood loss (chronic): Secondary | ICD-10-CM

## 2023-12-05 DIAGNOSIS — R569 Unspecified convulsions: Secondary | ICD-10-CM | POA: Diagnosis not present

## 2023-12-05 DIAGNOSIS — R768 Other specified abnormal immunological findings in serum: Secondary | ICD-10-CM

## 2023-12-05 DIAGNOSIS — F1290 Cannabis use, unspecified, uncomplicated: Secondary | ICD-10-CM

## 2023-12-05 DIAGNOSIS — R87619 Unspecified abnormal cytological findings in specimens from cervix uteri: Secondary | ICD-10-CM

## 2023-12-05 DIAGNOSIS — E538 Deficiency of other specified B group vitamins: Secondary | ICD-10-CM

## 2023-12-05 DIAGNOSIS — E559 Vitamin D deficiency, unspecified: Secondary | ICD-10-CM | POA: Diagnosis not present

## 2023-12-05 DIAGNOSIS — R413 Other amnesia: Secondary | ICD-10-CM | POA: Diagnosis not present

## 2023-12-05 DIAGNOSIS — Z202 Contact with and (suspected) exposure to infections with a predominantly sexual mode of transmission: Secondary | ICD-10-CM

## 2023-12-05 DIAGNOSIS — Z30011 Encounter for initial prescription of contraceptive pills: Secondary | ICD-10-CM | POA: Diagnosis not present

## 2023-12-05 LAB — COMPREHENSIVE METABOLIC PANEL
ALT: 269 U/L — ABNORMAL HIGH (ref 0–35)
AST: 91 U/L — ABNORMAL HIGH (ref 0–37)
Albumin: 4.2 g/dL (ref 3.5–5.2)
Alkaline Phosphatase: 225 U/L — ABNORMAL HIGH (ref 39–117)
BUN: 14 mg/dL (ref 6–23)
CO2: 29 meq/L (ref 19–32)
Calcium: 9.2 mg/dL (ref 8.4–10.5)
Chloride: 102 meq/L (ref 96–112)
Creatinine, Ser: 0.69 mg/dL (ref 0.40–1.20)
GFR: 110.65 mL/min (ref >60.00)
Glucose, Bld: 77 mg/dL (ref 70–99)
Potassium: 4 meq/L (ref 3.5–5.1)
Sodium: 138 meq/L (ref 135–145)
Total Bilirubin: 0.6 mg/dL (ref 0.2–1.2)
Total Protein: 7.4 g/dL (ref 6.0–8.3)

## 2023-12-05 LAB — HCG, QUANTITATIVE, PREGNANCY: Quantitative HCG: <0.6 m[IU]/mL

## 2023-12-05 LAB — T4, FREE: Free T4: 0.78 ng/dL (ref 0.60–1.60)

## 2023-12-05 LAB — TSH: TSH: 1.6 u[IU]/mL (ref 0.35–5.50)

## 2023-12-05 LAB — HEMOGLOBIN A1C: Hgb A1c MFr Bld: 5.7 % (ref 4.6–6.5)

## 2023-12-05 LAB — VITAMIN D 25 HYDROXY (VIT D DEFICIENCY, FRACTURES): VITD: 17.26 ng/mL — ABNORMAL LOW (ref 30.00–100.00)

## 2023-12-05 LAB — POCT URINE PREGNANCY: Preg Test, Ur: NEGATIVE

## 2023-12-05 MED ORDER — CHOLECALCIFEROL 1.25 MG (50000 UT) PO TABS: 1.0000 | ORAL_TABLET | ORAL | 0 refills | Status: DC

## 2023-12-05 MED ORDER — NORETHINDRONE 0.35 MG PO TABS: 1.0000 | ORAL_TABLET | Freq: Every day | ORAL | 11 refills | Status: DC

## 2023-12-05 NOTE — Progress Notes (Signed)
 Please call and advise patient that her liver function was extremely elevated.  Please assess if she is having any right upper quadrant abdominal pain?  These levels are pretty extremely high.  Has she had any recent Tylenol, alcohol, herbal supplements or teas?  Has she ever been told that she has elevated liver functions or liver issues in the past?  [facility] center off [address], [place] [address] Phone [phone]-  8-5 pm    Additionally her A1c shows that she is in the prediabetic range.  Her thyroid is completely normal.  She is not pregnant.  Her vitamin D is very low I am going to send her in a prescription vitamin D that she will take for 12 weeks followed by over-the-counter vitamin D.  It does look like she is overdue to see her gastroenterologist so I do want her to call and make an appointment with them as well as they will also follow the liver function elevation.

## 2023-12-05 NOTE — Progress Notes (Unsigned)
 New Patient Office Visit  Subjective:  Patient ID: Angie Schmidt, female    DOB: Feb 23, 1986  Age: 38 y.o. MRN: [id number]  CC:  Chief Complaint  Patient presents with  . Establish Care    HPI Angie Schmidt is here to establish care as a new patient.  Oriented to practice routines and expectations.  Prior provider was: has not had one in some time  Pt is with acute concerns.   Marijuana use, did try to quit for some time but she has since restarted because she was having crazy dreams without it. She has tried an edible but she had undesired side effects from it.   H/o seizures however has not had one in over a decade. She never was evaluated by a neurologist. She states last episode was at work, and she felt really light headed and had a slight drink and then she woke up her manager said that she had had a seizure. CT head without contrast, no acute abn, 2013. She does state she was told that she had seizures intermittent as a child but doesn't remember ever having a neurological workup.   Contraception, she isn't often sexually active  Has had an iud x 2 but hated it, and she also has tried the nuva ring. She does have difficultly taking medications so she is unsure how her compliance will be. She isn't interested in progesterone only.   She does state that around 6-8 pm she will notice her cheeks and her face will get red and feel hot around the bridge of her nose and cheeks. She doesn't report any regular routine that she does at any time with this. If she drinks alcohol her face does the same thing. She does report significant polyarthralgia in multiple joints.   chronic concerns:  IDA: sees hematology gets regular iron infusions     ROS: Negative unless specifically indicated above in HPI.   Current Outpatient Medications:  .  ibuprofen (ADVIL) 200 MG tablet, Take 200 mg by mouth every 6 (six) hours as needed., Disp: , Rfl:  .  norethindrone (ORTHO MICRONOR) 0.35 MG  tablet, Take 1 tablet (0.35 mg total) by mouth daily., Disp: 28 tablet, Rfl: 11 .  VENTOLIN HFA 108 (90 Base) MCG/ACT inhaler, Inhale 2 puffs into the lungs every 6 (six) hours as needed., Disp: , Rfl:  .  Cholecalciferol 1.25 MG (50000 UT) TABS, Take 1 tablet by mouth once a week. (Patient not taking: Reported on 12/07/2023), Disp: 8 tablet, Rfl: 0 .  HYDROcodone-acetaminophen (NORCO/VICODIN) 5-325 MG tablet, Take 1 tablet by mouth every 6 (six) hours as needed for moderate pain (pain score 4-6)., Disp: 30 tablet, Rfl: 0 Past Medical History:  Diagnosis Date  . Anemia   . Anxiety    NO MEDS  . Asthma   . Cholestasis during pregnancy in third trimester 11/05/2018  . GERD (gastroesophageal reflux disease)    NO MEDS  . Headache    FREQUENT HA'S  . Motion sickness   . Seizure (HCC)    LAST 2018  . Vaginal Pap smear, abnormal    Past Surgical History:  Procedure Laterality Date  . COLONOSCOPY WITH PROPOFOL N/A 02/14/2023   Procedure: COLONOSCOPY WITH PROPOFOL;  Surgeon: [name], MD;  Location: [facility];  Service: Endoscopy;  Laterality: N/A;  . denies surgical history    . ESOPHAGOGASTRODUODENOSCOPY (EGD) WITH PROPOFOL N/A 02/14/2023   Procedure: ESOPHAGOGASTRODUODENOSCOPY (EGD) WITH PROPOFOL;  Surgeon: [name],  MD;  Location: [facility];  Service: Endoscopy;  Laterality: N/A;  . NO PAST SURGERIES      Objective:   Today's Vitals: BP 102/62 (BP Location: Left Arm, Patient Position: Sitting, Cuff Size: Normal)   Pulse 73   Temp 98 F (36.7 C) (Temporal)   Ht 5' (1.524 m)   Wt 185 lb (83.9 kg)   LMP 11/17/2023 (Exact Date)   SpO2 98%   BMI 36.13 kg/m   Physical Exam Constitutional:      General: She is not in acute distress.    Appearance: Normal appearance. She is not ill-appearing.  Pulmonary:     Effort: Pulmonary effort is normal.  Neurological:     General: No focal deficit present.     Mental Status: She is alert and oriented  to person, place, and time.  Psychiatric:        Mood and Affect: Mood normal.        Behavior: Behavior normal.        Thought Content: Thought content normal.    Assessment & Plan:  Seizure-like activity [facility]) -     Ambulatory referral to Neurology  Encounter for initial prescription of contraceptive pills Assessment & Plan: Discussed options. Will start on norethindrone. Hcg negative.   Orders: -     Norethindrone; Take 1 tablet (0.35 mg total) by mouth daily.  Dispense: 28 tablet; Refill: 11  Facial rash Assessment & Plan: Suspect reactive and or low suspicion for butterfly rash as not constant however will order ana    Abnormal cervical Papanicolaou smear, unspecified abnormal pap finding Assessment & Plan: Patient overdue did not follow-up with gynecology.  Patient to make appointment for Pap smear when she is next available.  Discussed importance of getting this done   Iron deficiency anemia due to chronic blood loss Assessment & Plan: Ordering cbc pending results.    Celiac disease Assessment & Plan: Stressed importance of gluten free diet.  Pt followed with GI, overdue advised to make f/u appt   Marijuana use Assessment & Plan: Smoking cessation instruction/counseling given:  counseled pts on dangers of use, and methods for possible cessation.    Seizures (HCC)--last seizure age 37 Assessment & Plan: Pt never with official workup no recent episode however requesting neurological evaluation  Mom with h/o seizures as well   Obesity (BMI 35.0-39.9 without comorbidity) Assessment & Plan: Pt advised to work on diet and exercise as tolerated    B12 deficiency -     Vitamin B12; Future    Follow-up: Return in about 1 month (around 01/05/2024) for f/u PAP.   Mort Sawyers, FNP

## 2023-12-06 ENCOUNTER — Ambulatory Visit
Admission: RE | Admit: 2023-12-06 | Discharge: 2023-12-06 | Disposition: A | Discharge status: Home / Self Care | Source: Ambulatory Visit | Attending: Family | Admitting: Family

## 2023-12-06 ENCOUNTER — Other Ambulatory Visit: Payer: Self-pay | Admitting: Family

## 2023-12-06 DIAGNOSIS — R7989 Other specified abnormal findings of blood chemistry: Secondary | ICD-10-CM | POA: Diagnosis not present

## 2023-12-06 DIAGNOSIS — K8020 Calculus of gallbladder without cholecystitis without obstruction: Secondary | ICD-10-CM | POA: Diagnosis not present

## 2023-12-06 DIAGNOSIS — K828 Other specified diseases of gallbladder: Secondary | ICD-10-CM

## 2023-12-06 LAB — PTH, INTACT AND CALCIUM
Calcium: 9.4 mg/dL (ref 8.6–10.2)
PTH: 42 pg/mL (ref 16–77)

## 2023-12-06 NOTE — Progress Notes (Signed)
 Called and spoke with pt she does state six days ago had ruq abdominal pain that radiated to her right upper shoulder and it was so intense she was clammy and 'felt like she was going to die'. She laid down on the floor in a fetal position with a fan on her until it passed. Has not felt this since, did have a 'stomach ache' a few days but this has also improved. Advised her there is a stone in the neck of her gallbladder and with elevation of lfts recommend Er.Pt would like to avoid Er, I did identify risk with this as well, stat referral placed for surgeon eval. Advised if any sudden return of sx er or 911 to be called immediately.

## 2023-12-06 NOTE — H&P (View-Only) (Signed)
 Patient ID: NOTNAMED CROUCHER, female   DOB: 23-Dec-1985, 38 y.o.   MRN: [id number]  Chief Complaint: Gallstones, abnormal LFTs.  History of Present Illness Angie Schmidt is a 38 y.o. female with a long history of postprandial nausea and loose stools that she attributed to her celiac disease.  She does have some right upper quadrant pain which caught her attention following a ham and cheese croissant for breakfast.  No history hepatitis or jaundice, no history of acholic stools.  An ultrasound was obtained by her primary care which revealed gallstones and a common bile duct of 7 mm.  Lab work also showed an elevated alkaline phosphatase along with a transaminase elevation.  However total bilirubin was within normal limits.  Past Medical History Past Medical History:  Diagnosis Date   Anemia    Anxiety    NO MEDS   Asthma    Cholestasis during pregnancy in third trimester 11/05/2018   GERD (gastroesophageal reflux disease)    NO MEDS   Headache    FREQUENT HA'S   Motion sickness    Seizure (HCC)    LAST 2018   Vaginal Pap smear, abnormal       Past Surgical History:  Procedure Laterality Date   COLONOSCOPY WITH PROPOFOL N/A 02/14/2023   Procedure: COLONOSCOPY WITH PROPOFOL;  Surgeon: [name], MD;  Location: [facility];  Service: Endoscopy;  Laterality: N/A;   denies surgical history     ESOPHAGOGASTRODUODENOSCOPY (EGD) WITH PROPOFOL N/A 02/14/2023   Procedure: ESOPHAGOGASTRODUODENOSCOPY (EGD) WITH PROPOFOL;  Surgeon: [name], MD;  Location: [facility];  Service: Endoscopy;  Laterality: N/A;   NO PAST SURGERIES      Allergies  Allergen Reactions   Wellbutrin [Bupropion] Other (See Comments)    H/o seizures Will lower seizure threshold    Current Outpatient Medications  Medication Sig Dispense Refill   ibuprofen (ADVIL) 200 MG tablet Take 200 mg by mouth every 6 (six) hours as needed.     norethindrone (ORTHO MICRONOR) 0.35 MG tablet  Take 1 tablet (0.35 mg total) by mouth daily. 28 tablet 11   VENTOLIN HFA 108 (90 Base) MCG/ACT inhaler Inhale 2 puffs into the lungs every 6 (six) hours as needed.     Cholecalciferol 1.25 MG (50000 UT) TABS Take 1 tablet by mouth once a week. (Patient not taking: Reported on 12/07/2023) 8 tablet 0   No current facility-administered medications for this visit.    Family History Family History  Problem Relation Age of Onset   Diabetes Paternal Grandfather    Diabetes Paternal Grandmother    Cancer Maternal Grandmother    Diabetes Maternal Grandmother    Lung disease Maternal Grandmother    Diabetes Maternal Grandfather    Hypertension Maternal Grandfather    Diabetes Father    Cancer Mother    Diabetes Mother    Hypertension Mother    Seizures Mother    Liver disease Mother    Clotting disorder Mother       Social History Social History   Tobacco Use   Smoking status: Former    Current packs/day: 0.00    Average packs/day: 0.3 packs/day for 17.0 years (4.3 ttl pk-yrs)    Types: Cigarettes    Start date: 09/10/2000    Quit date: 09/10/2017    Years since quitting: 6.2    Passive exposure: Never   Smokeless tobacco: Never  Vaping Use   Vaping status: Never Used  Substance Use Topics  Alcohol use: Yes    Alcohol/week: 3.0 standard drinks of alcohol    Types: 3 Glasses of wine per week    Comment: occasionally   Drug use: Yes    Types: Marijuana    Comment: last use 04/25/21        Review of Systems  Constitutional: Negative.   HENT: Negative.    Eyes: Negative.   Respiratory: Negative.    Cardiovascular: Negative.   Gastrointestinal:  Positive for abdominal pain, diarrhea and nausea.  Genitourinary:  Positive for frequency.  Skin: Negative.   Neurological:  Positive for dizziness.  Psychiatric/Behavioral: Negative.       Physical Exam Blood pressure 104/62, pulse 75, temperature 98.2 F (36.8 C), height 5' (1.524 m), weight 184 lb (83.5 kg), last  menstrual period 11/17/2023, SpO2 98%. Last Weight  Most recent update: 12/07/2023 10:23 AM    Weight  83.5 kg (184 lb)             CONSTITUTIONAL: Well developed, and nourished, appropriately responsive and aware without distress.   EYES: Sclera non-icteric.   EARS, NOSE, MOUTH AND THROAT:  The oropharynx is clear. Oral mucosa is pink and moist. Hearing is intact to voice.  NECK: Trachea is midline, and there is no jugular venous distension.  LYMPH NODES:  Lymph nodes in the neck are not appreciated. RESPIRATORY:  Lungs are clear, and breath sounds are equal bilaterally.  Normal respiratory effort without pathologic use of accessory muscles. CARDIOVASCULAR: Heart is regular in rate and rhythm.   Well perfused.  GI: The abdomen is  soft, nontender, and nondistended. There were no palpable masses.  I did not appreciate hepatosplenomegaly. There were normal bowel sounds.   MUSCULOSKELETAL:  Symmetrical muscle tone appreciated in all four extremities.    SKIN: Skin turgor is normal. No pathologic skin lesions appreciated.  NEUROLOGIC:  Motor and sensation appear grossly normal.  Cranial nerves are grossly without defect. PSYCH:  Alert and oriented to person, place and time. Affect is appropriate for situation.  Data Reviewed I have personally reviewed what is currently available of the patient's imaging, recent labs and medical records.   Labs:     Latest Ref Rng & Units 08/15/2023    9:52 AM 02/13/2023    1:13 PM 09/06/2022    2:36 PM  CBC  WBC 4.0 - 10.5 K/uL 6.9  6.9  6.2   Hemoglobin 12.0 - 15.0 g/dL 16.1  09.6  04.5   Hematocrit 36.0 - 46.0 % 36.8  37.9  38.0   Platelets 150 - 400 K/uL 367  359  346       Latest Ref Rng & Units 12/05/2023    4:26 PM 12/05/2023   10:28 AM 11/05/2018    5:31 PM  CMP  Glucose 70 - 99 mg/dL  77  409   BUN 6 - 23 mg/dL  14  8   Creatinine 8.11 - 1.20 mg/dL  9.14  7.82   Sodium 956 - 145 mEq/L  138  135   Potassium 3.5 - 5.1 mEq/L  4.0  3.4    Chloride 96 - 112 mEq/L  102  109   CO2 19 - 32 mEq/L  29  18   Calcium 8.6 - 10.2 mg/dL 9.4  9.2  8.3   Total Protein 6.0 - 8.3 g/dL  7.4  7.2   Total Bilirubin 0.2 - 1.2 mg/dL  0.6  0.6   Alkaline Phos 39 - 117 U/L  225  243   AST 0 - 37 U/L  91  25   ALT 0 - 35 U/L  269  23       Imaging: Radiological images reviewed:   Within last 24 hrs: US ABDOMEN LIMITED RUQ (LIVER/GB) Result Date: 12/06/2023 CLINICAL DATA:  Elevated liver function tests EXAM: ULTRASOUND ABDOMEN LIMITED RIGHT UPPER QUADRANT COMPARISON:  None Available. FINDINGS: Gallbladder: Dilated gallbladder. Stone towards the neck of the gallbladder. No wall thickening or adjacent fluid. This stone does not move with change in patient position. Common bile duct: Diameter: 7 mm, mildly enlarged. Liver: No focal lesion identified. Within normal limits in parenchymal echogenicity. Portal vein is patent on color Doppler imaging with normal direction of blood flow towards the liver. Other: None. IMPRESSION: Dilated gallbladder with stone impacted in the neck of the gallbladder. No further sonographic evidence of acute cholecystitis at this time. No Murphy's sign. Common duct is mildly ectatic at 7 mm. With these changes would recommend further evaluation such as MRCP or HIDA scan. Electronically Signed   By: Karen Kays M.D.   On: 12/06/2023 11:53    Assessment    Symptomatic cholelithiasis with likely adjacent hepatic inflammatory changes creating abnormal LFTs.  Slightly enlarged common bile duct of 7 mm. Patient Active Problem List   Diagnosis Date Noted   Obesity (BMI 35.0-39.9 without comorbidity) 12/05/2023   Memory changes 12/05/2023   Family history of diabetes mellitus 12/05/2023   Facial rash 12/05/2023   Celiac disease 08/15/2023   B12 deficiency 02/13/2023   Iron deficiency anemia 07/04/2022   Abnormal Pap smear of cervix 04/26/21 ASCUS HPV+ 04/29/2021   Seizures (HCC)--last seizure age 79 04/26/2021   Marijuana  use 04/26/2021    Plan    We believe it is prudent to proceed with MRCP in this scenario. Will anticipate proceeding with robotic cholecystectomy with ICG imaging in the very near future. Will go ahead and prescribe some Norco that hopefully will only be utilized postop.  Again encouraged her to be cautious with her diet and avoid anything with any unnecessary fats oils or fried.  This was discussed thoroughly.  Optimal plan is for robotic cholecystectomy utilizing ICG imaging. Risks and benefits have been discussed with the patient which include but are not limited to anesthesia, bleeding, infection, biliary ductal injury, resulting in leak or stenosis, other associated unanticipated injuries affiliated with laparoscopic surgery.   Reviewed that removing the gallbladder will only address the symptoms related to the gallbladder itself.  I believe there is the desire to proceed, accepting the risks with understanding.  Questions elicited and answered to satisfaction.    No guarantees ever expressed or implied.    Face-to-face time spent with the patient and accompanying care providers(if present) was 35 minutes, spent counseling, educating, and coordinating care of the patient.    These notes generated with voice recognition software. I apologize for typographical errors.  Campbell Lerner M.D., FACS 12/07/2023, 11:10 AM

## 2023-12-06 NOTE — Progress Notes (Unsigned)
 Patient ID: Angie Schmidt, female   DOB: November 15, 1985, 38 y.o.   MRN: [id number]  Chief Complaint: Gallstones, abnormal LFTs.  History of Present Illness Angie Schmidt is a 38 y.o. female with a long history of postprandial nausea and loose stools that she attributed to her celiac disease.  She does have some right upper quadrant pain which caught her attention following a ham and cheese croissant for breakfast.  No history hepatitis or jaundice, no history of acholic stools.  An ultrasound was obtained by her primary care which revealed gallstones and a common bile duct of 7 mm.  Lab work also showed an elevated alkaline phosphatase along with a transaminase elevation.  However total bilirubin was within normal limits.  Past Medical History Past Medical History:  Diagnosis Date   Anemia    Anxiety    NO MEDS   Asthma    Cholestasis during pregnancy in third trimester 11/05/2018   GERD (gastroesophageal reflux disease)    NO MEDS   Headache    FREQUENT HA'S   Motion sickness    Seizure (HCC)    LAST 2018   Vaginal Pap smear, abnormal       Past Surgical History:  Procedure Laterality Date   COLONOSCOPY WITH PROPOFOL N/A 02/14/2023   Procedure: COLONOSCOPY WITH PROPOFOL;  Surgeon: [name], MD;  Location: [facility];  Service: Endoscopy;  Laterality: N/A;   denies surgical history     ESOPHAGOGASTRODUODENOSCOPY (EGD) WITH PROPOFOL N/A 02/14/2023   Procedure: ESOPHAGOGASTRODUODENOSCOPY (EGD) WITH PROPOFOL;  Surgeon: [name], MD;  Location: [facility];  Service: Endoscopy;  Laterality: N/A;   NO PAST SURGERIES      Allergies  Allergen Reactions   Wellbutrin [Bupropion] Other (See Comments)    H/o seizures Will lower seizure threshold    Current Outpatient Medications  Medication Sig Dispense Refill   ibuprofen (ADVIL) 200 MG tablet Take 200 mg by mouth every 6 (six) hours as needed.     norethindrone (ORTHO MICRONOR) 0.35 MG tablet  Take 1 tablet (0.35 mg total) by mouth daily. 28 tablet 11   VENTOLIN HFA 108 (90 Base) MCG/ACT inhaler Inhale 2 puffs into the lungs every 6 (six) hours as needed.     Cholecalciferol 1.25 MG (50000 UT) TABS Take 1 tablet by mouth once a week. (Patient not taking: Reported on 12/07/2023) 8 tablet 0   No current facility-administered medications for this visit.    Family History Family History  Problem Relation Age of Onset   Diabetes Paternal Grandfather    Diabetes Paternal Grandmother    Cancer Maternal Grandmother    Diabetes Maternal Grandmother    Lung disease Maternal Grandmother    Diabetes Maternal Grandfather    Hypertension Maternal Grandfather    Diabetes Father    Cancer Mother    Diabetes Mother    Hypertension Mother    Seizures Mother    Liver disease Mother    Clotting disorder Mother       Social History Social History   Tobacco Use   Smoking status: Former    Current packs/day: 0.00    Average packs/day: 0.3 packs/day for 17.0 years (4.3 ttl pk-yrs)    Types: Cigarettes    Start date: 09/10/2000    Quit date: 09/10/2017    Years since quitting: 6.2    Passive exposure: Never   Smokeless tobacco: Never  Vaping Use   Vaping status: Never Used  Substance Use Topics  Alcohol use: Yes    Alcohol/week: 3.0 standard drinks of alcohol    Types: 3 Glasses of wine per week    Comment: occasionally   Drug use: Yes    Types: Marijuana    Comment: last use 04/25/21        Review of Systems  Constitutional: Negative.   HENT: Negative.    Eyes: Negative.   Respiratory: Negative.    Cardiovascular: Negative.   Gastrointestinal:  Positive for abdominal pain, diarrhea and nausea.  Genitourinary:  Positive for frequency.  Skin: Negative.   Neurological:  Positive for dizziness.  Psychiatric/Behavioral: Negative.       Physical Exam Blood pressure 104/62, pulse 75, temperature 98.2 F (36.8 C), height 5' (1.524 m), weight 184 lb (83.5 kg), last  menstrual period 11/17/2023, SpO2 98%. Last Weight  Most recent update: 12/07/2023 10:23 AM    Weight  83.5 kg (184 lb)             CONSTITUTIONAL: Well developed, and nourished, appropriately responsive and aware without distress.   EYES: Sclera non-icteric.   EARS, NOSE, MOUTH AND THROAT:  The oropharynx is clear. Oral mucosa is pink and moist. Hearing is intact to voice.  NECK: Trachea is midline, and there is no jugular venous distension.  LYMPH NODES:  Lymph nodes in the neck are not appreciated. RESPIRATORY:  Lungs are clear, and breath sounds are equal bilaterally.  Normal respiratory effort without pathologic use of accessory muscles. CARDIOVASCULAR: Heart is regular in rate and rhythm.   Well perfused.  GI: The abdomen is  soft, nontender, and nondistended. There were no palpable masses.  I did not appreciate hepatosplenomegaly. There were normal bowel sounds.   MUSCULOSKELETAL:  Symmetrical muscle tone appreciated in all four extremities.    SKIN: Skin turgor is normal. No pathologic skin lesions appreciated.  NEUROLOGIC:  Motor and sensation appear grossly normal.  Cranial nerves are grossly without defect. PSYCH:  Alert and oriented to person, place and time. Affect is appropriate for situation.  Data Reviewed I have personally reviewed what is currently available of the patient's imaging, recent labs and medical records.   Labs:     Latest Ref Rng & Units 08/15/2023    9:52 AM 02/13/2023    1:13 PM 09/06/2022    2:36 PM  CBC  WBC 4.0 - 10.5 K/uL 6.9  6.9  6.2   Hemoglobin 12.0 - 15.0 g/dL 29.5  62.1  30.8   Hematocrit 36.0 - 46.0 % 36.8  37.9  38.0   Platelets 150 - 400 K/uL 367  359  346       Latest Ref Rng & Units 12/05/2023    4:26 PM 12/05/2023   10:28 AM 11/05/2018    5:31 PM  CMP  Glucose 70 - 99 mg/dL  77  657   BUN 6 - 23 mg/dL  14  8   Creatinine 8.46 - 1.20 mg/dL  9.62  9.52   Sodium 841 - 145 mEq/L  138  135   Potassium 3.5 - 5.1 mEq/L  4.0  3.4    Chloride 96 - 112 mEq/L  102  109   CO2 19 - 32 mEq/L  29  18   Calcium 8.6 - 10.2 mg/dL 9.4  9.2  8.3   Total Protein 6.0 - 8.3 g/dL  7.4  7.2   Total Bilirubin 0.2 - 1.2 mg/dL  0.6  0.6   Alkaline Phos 39 - 117 U/L  225  243   AST 0 - 37 U/L  91  25   ALT 0 - 35 U/L  269  23       Imaging: Radiological images reviewed:   Within last 24 hrs: US ABDOMEN LIMITED RUQ (LIVER/GB) Result Date: 12/06/2023 CLINICAL DATA:  Elevated liver function tests EXAM: ULTRASOUND ABDOMEN LIMITED RIGHT UPPER QUADRANT COMPARISON:  None Available. FINDINGS: Gallbladder: Dilated gallbladder. Stone towards the neck of the gallbladder. No wall thickening or adjacent fluid. This stone does not move with change in patient position. Common bile duct: Diameter: 7 mm, mildly enlarged. Liver: No focal lesion identified. Within normal limits in parenchymal echogenicity. Portal vein is patent on color Doppler imaging with normal direction of blood flow towards the liver. Other: None. IMPRESSION: Dilated gallbladder with stone impacted in the neck of the gallbladder. No further sonographic evidence of acute cholecystitis at this time. No Murphy's sign. Common duct is mildly ectatic at 7 mm. With these changes would recommend further evaluation such as MRCP or HIDA scan. Electronically Signed   By: Karen Kays M.D.   On: 12/06/2023 11:53    Assessment    Symptomatic cholelithiasis with likely adjacent hepatic inflammatory changes creating abnormal LFTs.  Slightly enlarged common bile duct of 7 mm. Patient Active Problem List   Diagnosis Date Noted   Obesity (BMI 35.0-39.9 without comorbidity) 12/05/2023   Memory changes 12/05/2023   Family history of diabetes mellitus 12/05/2023   Facial rash 12/05/2023   Celiac disease 08/15/2023   B12 deficiency 02/13/2023   Iron deficiency anemia 07/04/2022   Abnormal Pap smear of cervix 04/26/21 ASCUS HPV+ 04/29/2021   Seizures (HCC)--last seizure age 1 04/26/2021   Marijuana  use 04/26/2021    Plan    We believe it is prudent to proceed with MRCP in this scenario. Will anticipate proceeding with robotic cholecystectomy with ICG imaging in the very near future. Will go ahead and prescribe some Norco that hopefully will only be utilized postop.  Again encouraged her to be cautious with her diet and avoid anything with any unnecessary fats oils or fried.  This was discussed thoroughly.  Optimal plan is for robotic cholecystectomy utilizing ICG imaging. Risks and benefits have been discussed with the patient which include but are not limited to anesthesia, bleeding, infection, biliary ductal injury, resulting in leak or stenosis, other associated unanticipated injuries affiliated with laparoscopic surgery.   Reviewed that removing the gallbladder will only address the symptoms related to the gallbladder itself.  I believe there is the desire to proceed, accepting the risks with understanding.  Questions elicited and answered to satisfaction.    No guarantees ever expressed or implied.    Face-to-face time spent with the patient and accompanying care providers(if present) was 35 minutes, spent counseling, educating, and coordinating care of the patient.    These notes generated with voice recognition software. I apologize for typographical errors.  Campbell Lerner M.D., FACS 12/07/2023, 11:10 AM

## 2023-12-07 ENCOUNTER — Encounter: Payer: Self-pay | Admitting: Family

## 2023-12-07 ENCOUNTER — Ambulatory Visit: Admitting: Surgery

## 2023-12-07 ENCOUNTER — Ambulatory Visit: Admission: RE | Admit: 2023-12-07 | Source: Ambulatory Visit

## 2023-12-07 ENCOUNTER — Telehealth: Payer: Self-pay | Admitting: Family

## 2023-12-07 ENCOUNTER — Other Ambulatory Visit: Payer: Self-pay | Admitting: Family

## 2023-12-07 ENCOUNTER — Ambulatory Visit: Payer: Self-pay | Admitting: Surgery

## 2023-12-07 ENCOUNTER — Encounter: Payer: Self-pay | Admitting: Surgery

## 2023-12-07 VITALS — BP 104/62 | HR 75 | Temp 98.2°F | Ht 60.0 in | Wt 184.0 lb

## 2023-12-07 DIAGNOSIS — K900 Celiac disease: Secondary | ICD-10-CM

## 2023-12-07 DIAGNOSIS — R768 Other specified abnormal immunological findings in serum: Secondary | ICD-10-CM

## 2023-12-07 DIAGNOSIS — R21 Rash and other nonspecific skin eruption: Secondary | ICD-10-CM

## 2023-12-07 DIAGNOSIS — K8021 Calculus of gallbladder without cholecystitis with obstruction: Secondary | ICD-10-CM

## 2023-12-07 DIAGNOSIS — K8010 Calculus of gallbladder with chronic cholecystitis without obstruction: Secondary | ICD-10-CM

## 2023-12-07 DIAGNOSIS — G8929 Other chronic pain: Secondary | ICD-10-CM | POA: Insufficient documentation

## 2023-12-07 DIAGNOSIS — M2550 Pain in unspecified joint: Secondary | ICD-10-CM

## 2023-12-07 DIAGNOSIS — R7989 Other specified abnormal findings of blood chemistry: Secondary | ICD-10-CM | POA: Diagnosis not present

## 2023-12-07 DIAGNOSIS — K8020 Calculus of gallbladder without cholecystitis without obstruction: Secondary | ICD-10-CM

## 2023-12-07 HISTORY — DX: Calculus of gallbladder with chronic cholecystitis without obstruction: K80.10

## 2023-12-07 MED ORDER — HYDROCODONE-ACETAMINOPHEN 5-325 MG PO TABS: 1.0000 | ORAL_TABLET | Freq: Four times a day (QID) | ORAL | 0 refills | Status: DC | PRN

## 2023-12-07 NOTE — Assessment & Plan Note (Signed)
 Patient overdue did not follow-up with gynecology.  Patient to make appointment for Pap smear when she is next available.  Discussed importance of getting this done

## 2023-12-07 NOTE — Assessment & Plan Note (Signed)
 Pt never with official workup no recent episode however requesting neurological evaluation  Mom with h/o seizures as well

## 2023-12-07 NOTE — Patient Instructions (Addendum)
 We will get you scheduled for an MRCP of the gallbladder and surrounding structures.    You are scheduled for the MRCP at [facility] today. You will need to arrive at the [facility] entrance at 5:30 am and check in at the Radiology desk. You may not have anything to eat or drink for 4 hours before this scan.  We will call you with your results.    You have requested to have your gallbladder removed. This will be done at [facility] with Dr. Claudine Mouton.  You will most likely be out of work 1-2 weeks for this surgery.  If you have FMLA or disability paperwork that needs filled out you may drop this off at our office or this can be faxed to [phone].  You will return after your post-op appointment with a lifting restriction for approximately 4 more weeks.  You will be able to eat anything you would like to following surgery. But, start by eating a bland diet and advance this as tolerated. The Gallbladder diet is below, please go as closely by this diet as possible prior to surgery to avoid any further attacks.  Please see the (blue)pre-care form that you have been given today. Our surgery scheduler will call you to verify surgery date and to go over information.   If you have any questions, please call our office.  Laparoscopic Cholecystectomy Laparoscopic cholecystectomy is surgery to remove the gallbladder. The gallbladder is located in the upper right part of the abdomen, behind the liver. It is a storage sac for bile, which is produced in the liver. Bile aids in the digestion and absorption of fats. Cholecystectomy is often done for inflammation of the gallbladder (cholecystitis). This condition is usually caused by a buildup of gallstones (cholelithiasis) in the gallbladder. Gallstones can block the flow of bile, and that can result in inflammation and pain. In severe cases, emergency surgery may be required. If emergency surgery is not required, you will have time to prepare  for the procedure. Laparoscopic surgery is an alternative to open surgery. Laparoscopic surgery has a shorter recovery time. Your common bile duct may also need to be examined during the procedure. If stones are found in the common bile duct, they may be removed. LET [facility] CARE PROVIDER KNOW ABOUT: Any allergies you have. All medicines you are taking, including vitamins, herbs, eye drops, creams, and over-the-counter medicines. Previous problems you or members of your family have had with the use of anesthetics. Any blood disorders you have. Previous surgeries you have had.  Any medical conditions you have. RISKS AND COMPLICATIONS Generally, this is a safe procedure. However, problems may occur, including: Infection. Bleeding. Allergic reactions to medicines. Damage to other structures or organs. A stone remaining in the common bile duct. A bile leak from the cyst duct that is clipped when your gallbladder is removed. The need to convert to open surgery, which requires a larger incision in the abdomen. This may be necessary if your surgeon thinks that it is not safe to continue with a laparoscopic procedure. BEFORE THE PROCEDURE Ask your health care provider about: Changing or stopping your regular medicines. This is especially important if you are taking diabetes medicines or blood thinners. Taking medicines such as aspirin and ibuprofen. These medicines can thin your blood. Do not take these medicines before your procedure if your health care provider instructs you not to. Follow instructions from your health care provider about eating or drinking restrictions. Let your health  care provider know if you develop a cold or an infection before surgery. Plan to have someone take you home after the procedure. Ask your health care provider how your surgical site will be marked or identified. You may be given antibiotic medicine to help prevent infection. PROCEDURE To reduce your risk of  infection: Your health care team will wash or sanitize their hands. Your skin will be washed with soap. An IV tube may be inserted into one of your veins. You will be given a medicine to make you fall asleep (general anesthetic). A breathing tube will be placed in your mouth. The surgeon will make several small cuts (incisions) in your abdomen. A thin, lighted tube (laparoscope) that has a tiny camera on the end will be inserted through one of the small incisions. The camera on the laparoscope will send a picture to a TV screen (monitor) in the operating room. This will give the surgeon a good view inside your abdomen. A gas will be pumped into your abdomen. This will expand your abdomen to give the surgeon more room to perform the surgery. Other tools that are needed for the procedure will be inserted through the other incisions. The gallbladder will be removed through one of the incisions. After your gallbladder has been removed, the incisions will be closed with stitches (sutures), staples, or skin glue. Your incisions may be covered with a bandage (dressing). The procedure may vary among health care providers and hospitals. AFTER THE PROCEDURE Your blood pressure, heart rate, breathing rate, and blood oxygen level will be monitored often until the medicines you were given have worn off. You will be given medicines as needed to control your pain.   This information is not intended to replace advice given to you by your health care provider. Make sure you discuss any questions you have with your health care provider.   Document Released: 09/19/2005 Document Revised: 06/10/2015 Document Reviewed: 05/01/2013 Elsevier Interactive Patient Education 2016 Else[employer] Inc.   Low-Fat Diet for Gallbladder Conditions A low-fat diet can be helpful if you have pancreatitis or a gallbladder condition. With these conditions, your pancreas and gallbladder have trouble digesting fats. A healthy eating plan  with less fat will help rest your pancreas and gallbladder and reduce your symptoms. WHAT DO I NEED TO KNOW ABOUT THIS DIET? Eat a low-fat diet. Reduce your fat intake to less than 20-30% of your total daily calories. This is less than 50-60 g of fat per day. Remember that you need some fat in your diet. Ask your dietician what your daily goal should be. Choose nonfat and low-fat healthy foods. Look for the words "nonfat," "low fat," or "fat free." As a guide, look on the label and choose foods with less than 3 g of fat per serving. Eat only one serving. Avoid alcohol. Do not smoke. If you need help quitting, talk with your health care provider. Eat small frequent meals instead of three large heavy meals. WHAT FOODS CAN I EAT? Grains Include healthy grains and starches such as potatoes, wheat bread, fiber-rich cereal, and brown rice. Choose whole grain options whenever possible. In adults, whole grains should account for 45-65% of your daily calories.  Fruits and Vegetables Eat plenty of fruits and vegetables. Fresh fruits and vegetables add fiber to your diet. Meats and Other Protein Sources Eat lean meat such as chicken and pork. Trim any fat off of meat before cooking it. Eggs, fish, and beans are other sources of protein. In adults,  these foods should account for 10-35% of your daily calories. Dairy Choose low-fat milk and dairy options. Dairy includes fat and protein, as well as calcium.  Fats and Oils Limit high-fat foods such as fried foods, sweets, baked goods, sugary drinks.  Other Creamy sauces and condiments, such as mayonnaise, can add extra fat. Think about whether or not you need to use them, or use smaller amounts or low fat options. WHAT FOODS ARE NOT RECOMMENDED? High fat foods, such as: [employer]. Ice cream. [place] toast. Sweet rolls. Pizza. Cheese bread. Foods covered with batter, butter, creamy sauces, or cheese. Fried foods. Sugary drinks and desserts. Foods  that cause gas or bloating   This information is not intended to replace advice given to you by your health care provider. Make sure you discuss any questions you have with your health care provider.   Document Released: 09/24/2013 Document Reviewed: 09/24/2013 Elsevier Interactive Patient Education [employer]

## 2023-12-07 NOTE — Telephone Encounter (Signed)
 Can we please add on b12

## 2023-12-07 NOTE — Assessment & Plan Note (Signed)
Ordering cbc pending results ?

## 2023-12-07 NOTE — Assessment & Plan Note (Signed)
 Discussed options. Will start on norethindrone. Hcg negative.

## 2023-12-07 NOTE — Assessment & Plan Note (Signed)
 Assess b12 and tsh pending results.  Could contribute with anxiety as well H/o seizures pt to see neurology as well for consult

## 2023-12-07 NOTE — Assessment & Plan Note (Signed)
 Pt advised to work on diet and exercise as tolerated

## 2023-12-07 NOTE — Assessment & Plan Note (Signed)
 Suspect reactive and or low suspicion for butterfly rash as not constant however will order ana

## 2023-12-07 NOTE — Assessment & Plan Note (Signed)
 Stressed importance of gluten free diet.  Pt followed with GI, overdue advised to make f/u appt

## 2023-12-07 NOTE — Assessment & Plan Note (Signed)
 Smoking cessation instruction/counseling given:  counseled pts on dangers of use, and methods for possible cessation.

## 2023-12-08 ENCOUNTER — Encounter: Payer: Self-pay | Admitting: Surgery

## 2023-12-08 ENCOUNTER — Telehealth: Payer: Self-pay | Admitting: Surgery

## 2023-12-08 ENCOUNTER — Other Ambulatory Visit: Payer: Self-pay | Admitting: Family

## 2023-12-08 ENCOUNTER — Ambulatory Visit

## 2023-12-08 DIAGNOSIS — R350 Frequency of micturition: Secondary | ICD-10-CM

## 2023-12-08 LAB — CHLAMYDIA/GONOCOCCUS/TRICHOMONAS, NAA
Chlamydia by NAA: POSITIVE — AB
Gonococcus by NAA: NEGATIVE
Trich vag by NAA: NEGATIVE

## 2023-12-08 NOTE — Telephone Encounter (Signed)
 Patient has been advised of Pre-Admission date/time, and Surgery date at [facility].  Surgery Date: 12/13/23 Preadmission Testing Date: 12/12/23 (phone 8a-1p)  Patient has been made aware to call [phone], between 1-3:00pm the day before surgery, to find out what time to arrive for surgery.

## 2023-12-10 LAB — TIER 2
Jo-1 Autoabs: <1 AI
SSA (Ro) (ENA) Antibody, IgG: <1 AI
SSB (La) (ENA) Antibody, IgG: <1 AI
Scleroderma (Scl-70) (ENA) Antibody, IgG: <1 AI

## 2023-12-10 LAB — TEST AUTHORIZATION

## 2023-12-10 LAB — TIER 1
Chromatin (Nucleosomal) Antibody: <1 AI
ENA SM Ab Ser-aCnc: <1 AI
Ribonucleic Protein(ENA) Antibody, IgG: <1 AI
SM/RNP: <1 AI
ds DNA Ab: 1 [IU]/mL

## 2023-12-10 LAB — RPR: RPR Ser Ql: NONREACTIVE

## 2023-12-10 LAB — ANA SCREEN,IFA,REFLEX TITER/PATTERN,REFLEX MPLX 11 AB CASCADE
Anti Nuclear Antibody (ANA): POSITIVE — AB
Cyclic Citrullin Peptide Ab: <16 U
MUTATED CITRULLINATED VIMENTIN (MCV) AB: <20 U/mL (ref <20)
Rheumatoid fact SerPl-aCnc: <10 [IU]/mL (ref <14)

## 2023-12-10 LAB — ANTI-NUCLEAR AB-TITER (ANA TITER)
ANA TITER: 1:80 {titer} — ABNORMAL HIGH
ANA Titer 1: 1:40 {titer} — ABNORMAL HIGH

## 2023-12-10 LAB — INTERPRETATION

## 2023-12-10 LAB — HIV ANTIBODY (ROUTINE TESTING W REFLEX): HIV 1&2 Ab, 4th Generation: NONREACTIVE

## 2023-12-10 LAB — TIER 3
Centromere Ab Screen: <1 AI
Ribosomal P Protein Ab: <1 AI

## 2023-12-10 LAB — HEPATITIS C ANTIBODY: Hepatitis C Ab: NONREACTIVE

## 2023-12-10 LAB — VITAMIN B12: Vitamin B-12: 474 pg/mL (ref 200–1100)

## 2023-12-11 ENCOUNTER — Other Ambulatory Visit: Payer: Self-pay | Admitting: Surgery

## 2023-12-11 ENCOUNTER — Other Ambulatory Visit

## 2023-12-11 ENCOUNTER — Ambulatory Visit
Admission: RE | Admit: 2023-12-11 | Discharge: 2023-12-11 | Disposition: A | Discharge status: Home / Self Care | Source: Ambulatory Visit | Attending: Surgery | Admitting: Surgery

## 2023-12-11 DIAGNOSIS — R7989 Other specified abnormal findings of blood chemistry: Secondary | ICD-10-CM | POA: Insufficient documentation

## 2023-12-11 DIAGNOSIS — K8020 Calculus of gallbladder without cholecystitis without obstruction: Secondary | ICD-10-CM | POA: Diagnosis not present

## 2023-12-11 DIAGNOSIS — K8021 Calculus of gallbladder without cholecystitis with obstruction: Secondary | ICD-10-CM | POA: Insufficient documentation

## 2023-12-11 DIAGNOSIS — G8929 Other chronic pain: Secondary | ICD-10-CM | POA: Insufficient documentation

## 2023-12-11 DIAGNOSIS — K838 Other specified diseases of biliary tract: Secondary | ICD-10-CM | POA: Diagnosis not present

## 2023-12-11 DIAGNOSIS — R1011 Right upper quadrant pain: Secondary | ICD-10-CM | POA: Insufficient documentation

## 2023-12-11 DIAGNOSIS — R932 Abnormal findings on diagnostic imaging of liver and biliary tract: Secondary | ICD-10-CM | POA: Diagnosis not present

## 2023-12-11 MED ORDER — GADOBUTROL 1 MMOL/ML IV SOLN
7.0000 mL | Freq: Once | INTRAVENOUS | Status: AC | PRN
  Administered 2023-12-11: 7 mL via INTRAVENOUS

## 2023-12-12 ENCOUNTER — Encounter: Payer: Self-pay | Admitting: Family

## 2023-12-12 ENCOUNTER — Ambulatory Visit: Admitting: Family

## 2023-12-12 ENCOUNTER — Other Ambulatory Visit (HOSPITAL_COMMUNITY)
Admission: RE | Admit: 2023-12-12 | Discharge: 2023-12-12 | Disposition: A | Discharge status: Home / Self Care | Source: Ambulatory Visit | Attending: Family | Admitting: Family

## 2023-12-12 ENCOUNTER — Other Ambulatory Visit: Payer: Self-pay

## 2023-12-12 ENCOUNTER — Encounter
Admission: RE | Admit: 2023-12-12 | Discharge: 2023-12-12 | Disposition: A | Discharge status: Home / Self Care | Source: Ambulatory Visit | Attending: Surgery | Admitting: Surgery

## 2023-12-12 ENCOUNTER — Telehealth: Payer: Self-pay | Admitting: Family

## 2023-12-12 VITALS — BP 110/76 | HR 88 | Ht 60.0 in | Wt 185.0 lb

## 2023-12-12 DIAGNOSIS — A749 Chlamydial infection, unspecified: Secondary | ICD-10-CM | POA: Diagnosis not present

## 2023-12-12 DIAGNOSIS — N898 Other specified noninflammatory disorders of vagina: Secondary | ICD-10-CM | POA: Diagnosis not present

## 2023-12-12 DIAGNOSIS — R87619 Unspecified abnormal cytological findings in specimens from cervix uteri: Secondary | ICD-10-CM

## 2023-12-12 DIAGNOSIS — Z01812 Encounter for preprocedural laboratory examination: Secondary | ICD-10-CM

## 2023-12-12 DIAGNOSIS — F419 Anxiety disorder, unspecified: Secondary | ICD-10-CM | POA: Diagnosis not present

## 2023-12-12 HISTORY — DX: Cardiac murmur, unspecified: R01.1

## 2023-12-12 HISTORY — DX: Celiac disease: K90.0

## 2023-12-12 MED ORDER — HYDROXYZINE PAMOATE 25 MG PO CAPS: 25.0000 mg | ORAL_CAPSULE | Freq: Three times a day (TID) | ORAL | 0 refills | Status: DC | PRN

## 2023-12-12 MED ORDER — CHLORHEXIDINE GLUCONATE 0.12 % MT SOLN
15.0000 mL | Freq: Once | OROMUCOSAL | Status: AC
Start: 2023-12-12 — End: 2023-12-13
  Administered 2023-12-13: 15 mL via OROMUCOSAL

## 2023-12-12 MED ORDER — ORAL CARE MOUTH RINSE: 15.0000 mL | Freq: Once | OROMUCOSAL | Status: AC

## 2023-12-12 MED ORDER — LACTATED RINGERS IV SOLN: INTRAVENOUS | Status: DC

## 2023-12-12 MED ORDER — DOXYCYCLINE HYCLATE 100 MG PO TABS: 100.0000 mg | ORAL_TABLET | Freq: Two times a day (BID) | ORAL | 0 refills | Status: AC

## 2023-12-12 NOTE — Assessment & Plan Note (Signed)
 Post colpo  Repeating pap today pending results

## 2023-12-12 NOTE — Assessment & Plan Note (Signed)
 Discussed treatment RX doxycycline 100 mg po bid x 7 days Advised pt to let sexual partner know Discussed safe sex Assessed for PID  Reporting to dept health

## 2023-12-12 NOTE — Patient Instructions (Addendum)
 Your procedure is scheduled on: 12/13/23 Report to the Registration Desk on the 1st floor of the [facility]. To find out your arrival time, please call [phone] between 1PM - 3PM on: 12/12/23 If your arrival time is 6:00 am, do not arrive before that time as the [facility] entrance doors do not open until 6:00 am.  REMEMBER: Instructions that are not followed completely may result in serious medical risk, up to and including death; or upon the discretion of your surgeon and anesthesiologist your surgery may need to be rescheduled.  Do not eat food or drink any liquids after midnight the night before surgery.  No gum chewing or hard candies.   One week prior to surgery: Stop Anti-inflammatories (NSAIDS) such as Advil, Aleve, Ibuprofen, Motrin, Naproxen, Naprosyn and Aspirin based products such as Excedrin, Goody's Powder, BC Powder. You may take Tylenol if needed for pain up until the day of surgery.  Stop ANY OVER THE COUNTER supplements until after surgery.   ON THE DAY OF SURGERY ONLY TAKE THESE MEDICATIONS WITH SIPS OF WATER:  none  Use inhaler VENTOLIN HFA 108  on the day of surgery and bring to the hospital.  No Alcohol for 24 hours before or after surgery.  No Smoking including e-cigarettes for 24 hours before surgery.  No chewable tobacco products for at least 6 hours before surgery.  No nicotine patches on the day of surgery.  Do not use any "recreational" drugs for at least a week (preferably 2 weeks) before your surgery.  Please be advised that the combination of cocaine and anesthesia may have negative outcomes, up to and including death. If you test positive for cocaine, your surgery will be cancelled.  On the morning of surgery brush your teeth with toothpaste and water, you may rinse your mouth with mouthwash if you wish. Do not swallow any toothpaste or mouthwash.  Use CHG Soap or wipes as directed on instruction sheet.  Do not wear jewelry, make-up,  hairpins, clips or nail polish.  For welded (permanent) jewelry: bracelets, anklets, waist bands, etc.  Please have this removed prior to surgery.  If it is not removed, there is a chance that hospital personnel will need to cut it off on the day of surgery.  Do not wear lotions, powders, or perfumes.   Do not shave body hair from the neck down 48 hours before surgery.  Contact lenses, hearing aids and dentures may not be worn into surgery.  Do not bring valuables to the hospital. [facility] is not responsible for any missing/lost belongings or valuables.   Notify your doctor if there is any change in your medical condition (cold, fever, infection).  Wear comfortable clothing (specific to your surgery type) to the hospital.  After surgery, you can help prevent lung complications by doing breathing exercises.  Take deep breaths and cough every 1-2 hours. Your doctor may order a device called an Incentive Spirometer to help you take deep breaths. When coughing or sneezing, hold a pillow firmly against your incision with both hands. This is called "splinting." Doing this helps protect your incision. It also decreases belly discomfort.  If you are being admitted to the hospital overnight, leave your suitcase in the car. After surgery it may be brought to your room.  In case of increased patient census, it may be necessary for you, the patient, to continue your postoperative care in the Same Day Surgery department.  If you are being discharged the day of  surgery, you will not be allowed to drive home. You will need a responsible individual to drive you home and stay with you for 24 hours after surgery.   If you are taking public transportation, you will need to have a responsible individual with you.  Please call the Pre-admissions Testing Dept. at [phone] if you have any questions about these instructions.  Surgery Visitation Policy:  Patients having surgery or a procedure may  have two visitors.  Children under the age of 32 must have an adult with them who is not the patient.  Temporary Visitor Restrictions Due to increasing cases of flu, RSV and COVID-19: Children ages 58 and under will not be able to visit patients in [facility] hospitals under most circumstances.  Inpatient Visitation:    Visiting hours are 7 a.m. to 8 p.m. Up to four visitors are allowed at one time in a patient room. The visitors may rotate out with other people during the day.  One visitor age 60 or older may stay with the patient overnight and must be in the room by 8 p.m.     Preparing for Surgery with CHLORHEXIDINE GLUCONATE (CHG) Soap  Chlorhexidine Gluconate (CHG) Soap  o An antiseptic cleaner that kills germs and bonds with the skin to continue killing germs even after washing  o Used for showering the night before surgery and morning of surgery  Before surgery, you can play an important role by reducing the number of germs on your skin.  CHG (Chlorhexidine gluconate) soap is an antiseptic cleanser which kills germs and bonds with the skin to continue killing germs even after washing.  Please do not use if you have an allergy to CHG or antibacterial soaps. If your skin becomes reddened/irritated stop using the CHG.  1. Shower the NIGHT BEFORE SURGERY and the MORNING OF SURGERY with CHG soap.  2. If you choose to wash your hair, wash your hair first as usual with your normal shampoo.  3. After shampooing, rinse your hair and body thoroughly to remove the shampoo.  4. Use CHG as you would any other liquid soap. You can apply CHG directly to the skin and wash gently with a scrungie or a clean washcloth.  5. Apply the CHG soap to your body only from the neck down. Do not use on open wounds or open sores. Avoid contact with your eyes, ears, mouth, and genitals (private parts). Wash face and genitals (private parts) with your normal soap.  6. Wash thoroughly, paying special  attention to the area where your surgery will be performed.  7. Thoroughly rinse your body with warm water.  8. Do not shower/wash with your normal soap after using and rinsing off the CHG soap.  9. Pat yourself dry with a clean towel.  10. Wear clean pajamas to bed the night before surgery.  12. Place clean sheets on your bed the night of your first shower and do not sleep with pets.  13. Shower again with the CHG soap on the day of surgery prior to arriving at the hospital.  14. Do not apply any deodorants/lotions/powders.  15. Please wear clean clothes to the hospital.

## 2023-12-12 NOTE — Assessment & Plan Note (Signed)
 Trial hydroxyxine 25 po tid prn anxiety

## 2023-12-12 NOTE — Telephone Encounter (Signed)
 Pt was positive for chlamydia  Treating  Please report

## 2023-12-12 NOTE — Progress Notes (Signed)
 Subjective:  Patient ID: Angie Schmidt, female    DOB: Nov 16, 1985  Age: 38 y.o. MRN: [id number]  Patient Care Team: Mort Sawyers, FNP as PCP - General (Family Medicine) Michaelyn Barter, MD as Consulting Physician (Oncology)   CC:  Chief Complaint  Patient presents with   Medical Management of Chronic Issues    Pap smear    HPI Angie Schmidt is a 38 y.o. female who presents today for an annual physical exam. She reports consuming a general diet. The patient has a physically strenuous job, but has no regular exercise apart from work.  She generally feels well. She reports sleeping fairly well. She does have additional problems to discuss today.   Vision:Not within last year Dental:No regular dental care   Last pap: 'long time ago'  C/o vaginal discharge medium texture discharge, irregular vaginal discharge.  Hpv positive 2022 however had a colposcopy without repeat pap  No breast discharge rash or mass.  Recent vaginal culture did come back positive for chlamydia 12/05/22 pt has not yet been treated. She has one sexual partner but they are not monogomous  Pt is with acute concerns.  She smokes marijuana before bed to be able to sleep. She is having nightmares without it however she is also pretty stressed over upcoming surgery.   Advanced Directives Patient does not have advanced directives    DEPRESSION SCREENING    12/05/2023   10:17 AM 04/26/2021    5:59 PM  PHQ 2/9 Scores  PHQ - 2 Score 2 0  PHQ- 9 Score 5      ROS: Negative unless specifically indicated above in HPI.    Current Outpatient Medications:    doxycycline (VIBRA-TABS) 100 MG tablet, Take 1 tablet (100 mg total) by mouth 2 (two) times daily for 7 days., Disp: 14 tablet, Rfl: 0   hydrOXYzine (VISTARIL) 25 MG capsule, Take 1 capsule (25 mg total) by mouth every 8 (eight) hours as needed., Disp: 30 capsule, Rfl: 0   VENTOLIN HFA 108 (90 Base) MCG/ACT inhaler, Inhale 2 puffs into the lungs every 6 (six)  hours as needed for shortness of breath or wheezing., Disp: , Rfl:    Cholecalciferol 1.25 MG (50000 UT) TABS, Take 1 tablet by mouth once a week. (Patient not taking: Reported on 12/12/2023), Disp: 8 tablet, Rfl: 0   HYDROcodone-acetaminophen (NORCO/VICODIN) 5-325 MG tablet, Take 1 tablet by mouth every 6 (six) hours as needed for moderate pain (pain score 4-6). (Patient not taking: Reported on 12/12/2023), Disp: 30 tablet, Rfl: 0   norethindrone (ORTHO MICRONOR) 0.35 MG tablet, Take 1 tablet (0.35 mg total) by mouth daily. (Patient not taking: Reported on 12/12/2023), Disp: 28 tablet, Rfl: 11    Objective:    BP 110/76 (BP Location: Left Arm, Patient Position: Sitting, Cuff Size: Normal)   Pulse 88   Ht 5' (1.524 m)   Wt 185 lb (83.9 kg)   LMP 11/17/2023 (Exact Date)   SpO2 98%   BMI 36.13 kg/m   BP Readings from Last 3 Encounters:  12/12/23 110/76  12/07/23 104/62  12/05/23 102/62      Physical Exam Constitutional:      General: She is not in acute distress.    Appearance: Normal appearance. She is normal weight. She is not ill-appearing.  HENT:     Head: Normocephalic.     Right Ear: Tympanic membrane normal.     Left Ear: Tympanic membrane normal.     Nose: Nose normal.  Mouth/Throat:     Mouth: Mucous membranes are moist.  Eyes:     Extraocular Movements: Extraocular movements intact.     Pupils: Pupils are equal, round, and reactive to light.  Cardiovascular:     Rate and Rhythm: Normal rate and regular rhythm.  Pulmonary:     Effort: Pulmonary effort is normal.     Breath sounds: Normal breath sounds.  Genitourinary:    Labia:        Right: Rash present.        Left: Rash present.      Vagina: No tenderness.     Cervix: No discharge, lesion or erythema.     Adnexa:        Right: No tenderness.         Left: No tenderness.    Musculoskeletal:        General: Normal range of motion.     Cervical back: Normal range of motion.  Skin:    General: Skin is  warm.     Capillary Refill: Capillary refill takes less than 2 seconds.  Neurological:     General: No focal deficit present.     Mental Status: She is alert.  Psychiatric:        Mood and Affect: Mood normal.        Behavior: Behavior normal.        Thought Content: Thought content normal.        Judgment: Judgment normal.          Assessment & Plan:  Anxiety Assessment & Plan: Trial hydroxyxine 25 po tid prn anxiety    Orders: -     hydrOXYzine Pamoate; Take 1 capsule (25 mg total) by mouth every 8 (eight) hours as needed.  Dispense: 30 capsule; Refill: 0  Chlamydia infection Assessment & Plan: Discussed treatment RX doxycycline 100 mg po bid x 7 days Advised pt to let sexual partner know Discussed safe sex Assessed for PID  Reporting to dept health   Orders: -     Cytology - PAP -     Doxycycline Hyclate; Take 1 tablet (100 mg total) by mouth 2 (two) times daily for 7 days.  Dispense: 14 tablet; Refill: 0  Vaginal discharge -     Cytology - PAP -     WET PREP BY MOLECULAR PROBE  Abnormal cervical Papanicolaou smear, unspecified abnormal pap finding Assessment & Plan: Post colpo  Repeating pap today pending results       Follow-up: Return in 6 months (on 06/13/2024) for f/u anxiety.   Mort Sawyers, FNP

## 2023-12-13 ENCOUNTER — Ambulatory Visit: Admitting: Anesthesiology

## 2023-12-13 ENCOUNTER — Other Ambulatory Visit: Payer: Self-pay

## 2023-12-13 ENCOUNTER — Encounter
Admission: RE | Disposition: A | Discharge status: Home / Self Care | Payer: Self-pay | Source: Home / Self Care | Attending: Surgery

## 2023-12-13 ENCOUNTER — Encounter: Payer: Self-pay | Admitting: Family

## 2023-12-13 ENCOUNTER — Encounter: Payer: Self-pay | Admitting: Surgery

## 2023-12-13 ENCOUNTER — Ambulatory Visit
Admission: RE | Admit: 2023-12-13 | Discharge: 2023-12-13 | Disposition: A | Discharge status: Home / Self Care | Attending: Surgery | Admitting: Surgery

## 2023-12-13 DIAGNOSIS — J45909 Unspecified asthma, uncomplicated: Secondary | ICD-10-CM | POA: Insufficient documentation

## 2023-12-13 DIAGNOSIS — K8020 Calculus of gallbladder without cholecystitis without obstruction: Secondary | ICD-10-CM | POA: Diagnosis not present

## 2023-12-13 DIAGNOSIS — K8021 Calculus of gallbladder without cholecystitis with obstruction: Secondary | ICD-10-CM | POA: Diagnosis not present

## 2023-12-13 DIAGNOSIS — G8929 Other chronic pain: Secondary | ICD-10-CM | POA: Diagnosis present

## 2023-12-13 DIAGNOSIS — Z6836 Body mass index (BMI) 36.0-36.9, adult: Secondary | ICD-10-CM | POA: Diagnosis not present

## 2023-12-13 DIAGNOSIS — K8010 Calculus of gallbladder with chronic cholecystitis without obstruction: Secondary | ICD-10-CM

## 2023-12-13 DIAGNOSIS — Z87891 Personal history of nicotine dependence: Secondary | ICD-10-CM | POA: Insufficient documentation

## 2023-12-13 DIAGNOSIS — E669 Obesity, unspecified: Secondary | ICD-10-CM | POA: Diagnosis not present

## 2023-12-13 DIAGNOSIS — K219 Gastro-esophageal reflux disease without esophagitis: Secondary | ICD-10-CM | POA: Diagnosis not present

## 2023-12-13 DIAGNOSIS — R1011 Right upper quadrant pain: Secondary | ICD-10-CM | POA: Diagnosis present

## 2023-12-13 DIAGNOSIS — Z01812 Encounter for preprocedural laboratory examination: Secondary | ICD-10-CM

## 2023-12-13 LAB — WET PREP BY MOLECULAR PROBE
Candida species: NOT DETECTED
Gardnerella vaginalis: NOT DETECTED
MICRO NUMBER:: 16186464
SPECIMEN QUALITY:: ADEQUATE
Trichomonas vaginosis: NOT DETECTED

## 2023-12-13 LAB — CBC WITH DIFFERENTIAL/PLATELET
Abs Immature Granulocytes: 0.02 10*3/uL (ref 0.00–0.07)
Basophils Absolute: 0.1 10*3/uL (ref 0.0–0.1)
Basophils Relative: 1 %
Eosinophils Absolute: 0.1 10*3/uL (ref 0.0–0.5)
Eosinophils Relative: 1 %
HCT: 37.2 % (ref 36.0–46.0)
Hemoglobin: 12.8 g/dL (ref 12.0–15.0)
Immature Granulocytes: 0 %
Lymphocytes Relative: 29 %
Lymphs Abs: 2.5 10*3/uL (ref 0.7–4.0)
MCH: 30.3 pg (ref 26.0–34.0)
MCHC: 34.4 g/dL (ref 30.0–36.0)
MCV: 88.2 fL (ref 80.0–100.0)
Monocytes Absolute: 0.4 10*3/uL (ref 0.1–1.0)
Monocytes Relative: 5 %
Neutro Abs: 5.5 10*3/uL (ref 1.7–7.7)
Neutrophils Relative %: 64 %
Platelets: 374 10*3/uL (ref 150–400)
RBC: 4.22 MIL/uL (ref 3.87–5.11)
RDW: 13 % (ref 11.5–15.5)
WBC: 8.5 10*3/uL (ref 4.0–10.5)
nRBC: 0 % (ref 0.0–0.2)

## 2023-12-13 LAB — COMPREHENSIVE METABOLIC PANEL
ALT: 35 U/L (ref 0–44)
AST: 19 U/L (ref 15–41)
Albumin: 3.7 g/dL (ref 3.5–5.0)
Alkaline Phosphatase: 106 U/L (ref 38–126)
Anion gap: 9 (ref 5–15)
BUN: 14 mg/dL (ref 6–20)
CO2: 25 mmol/L (ref 22–32)
Calcium: 8.8 mg/dL — ABNORMAL LOW (ref 8.9–10.3)
Chloride: 105 mmol/L (ref 98–111)
Creatinine, Ser: 0.69 mg/dL (ref 0.44–1.00)
GFR, Estimated: >60 mL/min (ref >60)
Glucose, Bld: 93 mg/dL (ref 70–99)
Potassium: 3.7 mmol/L (ref 3.5–5.1)
Sodium: 139 mmol/L (ref 135–145)
Total Bilirubin: 1 mg/dL (ref 0.0–1.2)
Total Protein: 7.2 g/dL (ref 6.5–8.1)

## 2023-12-13 LAB — POCT PREGNANCY, URINE: Preg Test, Ur: NEGATIVE

## 2023-12-13 SURGERY — CHOLECYSTECTOMY, ROBOT-ASSISTED, LAPAROSCOPIC
Anesthesia: General

## 2023-12-13 MED ORDER — OXYCODONE HCL 5 MG/5ML PO SOLN: 5.0000 mg | Freq: Once | ORAL | Status: AC | PRN

## 2023-12-13 MED ORDER — SUGAMMADEX SODIUM 200 MG/2ML IV SOLN
INTRAVENOUS | Status: AC
Start: 2023-12-13 — End: ?
  Filled 2023-12-13: qty 2

## 2023-12-13 MED ORDER — CELECOXIB 200 MG PO CAPS
ORAL_CAPSULE | ORAL | Status: AC
  Filled 2023-12-13: qty 1

## 2023-12-13 MED ORDER — ACETAMINOPHEN 10 MG/ML IV SOLN: 1000.0000 mg | Freq: Once | INTRAVENOUS | Status: DC | PRN

## 2023-12-13 MED ORDER — HYDROCODONE-ACETAMINOPHEN 5-325 MG PO TABS: 1.0000 | ORAL_TABLET | Freq: Four times a day (QID) | ORAL | 0 refills | Status: DC | PRN

## 2023-12-13 MED ORDER — FENTANYL CITRATE (PF) 100 MCG/2ML IJ SOLN
INTRAMUSCULAR | Status: AC
  Filled 2023-12-13: qty 2

## 2023-12-13 MED ORDER — OXYCODONE HCL 5 MG PO TABS
ORAL_TABLET | ORAL | Status: AC
  Filled 2023-12-13: qty 1

## 2023-12-13 MED ORDER — PROPOFOL 10 MG/ML IV BOLUS
INTRAVENOUS | Status: DC | PRN
  Administered 2023-12-13: 25 ug/kg/min via INTRAVENOUS
  Administered 2023-12-13: 170 mg via INTRAVENOUS

## 2023-12-13 MED ORDER — DEXMEDETOMIDINE HCL IN NACL 80 MCG/20ML IV SOLN
INTRAVENOUS | Status: DC | PRN
  Administered 2023-12-13: 8 ug via INTRAVENOUS
  Administered 2023-12-13: 4 ug via INTRAVENOUS

## 2023-12-13 MED ORDER — ROCURONIUM BROMIDE 10 MG/ML (PF) SYRINGE
PREFILLED_SYRINGE | INTRAVENOUS | Status: AC
  Filled 2023-12-13: qty 10

## 2023-12-13 MED ORDER — CHLORHEXIDINE GLUCONATE CLOTH 2 % EX PADS: 6.0000 | MEDICATED_PAD | Freq: Once | CUTANEOUS | Status: DC

## 2023-12-13 MED ORDER — DEXAMETHASONE SODIUM PHOSPHATE 10 MG/ML IJ SOLN
INTRAMUSCULAR | Status: AC
Start: 2023-12-13 — End: ?
  Filled 2023-12-13: qty 1

## 2023-12-13 MED ORDER — ONDANSETRON HCL 4 MG/2ML IJ SOLN
INTRAMUSCULAR | Status: DC | PRN
  Administered 2023-12-13: 4 mg via INTRAVENOUS

## 2023-12-13 MED ORDER — MIDAZOLAM HCL 2 MG/2ML IJ SOLN
INTRAMUSCULAR | Status: AC
  Filled 2023-12-13: qty 2

## 2023-12-13 MED ORDER — ONDANSETRON HCL 4 MG/2ML IJ SOLN
INTRAMUSCULAR | Status: AC
  Filled 2023-12-13: qty 2

## 2023-12-13 MED ORDER — BUPIVACAINE-EPINEPHRINE (PF) 0.25% -1:200000 IJ SOLN
INTRAMUSCULAR | Status: DC | PRN
  Administered 2023-12-13: 30 mL

## 2023-12-13 MED ORDER — INDOCYANINE GREEN 25 MG IV SOLR
INTRAVENOUS | Status: AC
  Filled 2023-12-13: qty 10

## 2023-12-13 MED ORDER — CHLORHEXIDINE GLUCONATE CLOTH 2 % EX PADS
6.0000 | MEDICATED_PAD | Freq: Once | CUTANEOUS | Status: AC
Start: 2023-12-13 — End: 2023-12-13
  Administered 2023-12-13: 6 via TOPICAL

## 2023-12-13 MED ORDER — LIDOCAINE HCL (PF) 2 % IJ SOLN
INTRAMUSCULAR | Status: AC
  Filled 2023-12-13: qty 5

## 2023-12-13 MED ORDER — PROPOFOL 10 MG/ML IV BOLUS
INTRAVENOUS | Status: AC
  Filled 2023-12-13: qty 20

## 2023-12-13 MED ORDER — SODIUM CHLORIDE 0.9 % IR SOLN
Status: DC | PRN
  Administered 2023-12-13: 1000 mL

## 2023-12-13 MED ORDER — BUPIVACAINE LIPOSOME 1.3 % IJ SUSP
20.0000 mL | Freq: Once | INTRAMUSCULAR | Status: DC
Start: 2023-12-13 — End: 2023-12-13

## 2023-12-13 MED ORDER — DEXAMETHASONE SODIUM PHOSPHATE 10 MG/ML IJ SOLN
INTRAMUSCULAR | Status: DC | PRN
  Administered 2023-12-13: 10 mg via INTRAVENOUS

## 2023-12-13 MED ORDER — FENTANYL CITRATE (PF) 100 MCG/2ML IJ SOLN
25.0000 ug | INTRAMUSCULAR | Status: DC | PRN
Start: 2023-12-13 — End: 2023-12-13
  Administered 2023-12-13: 25 ug via INTRAVENOUS
  Administered 2023-12-13 (×2): 50 ug via INTRAVENOUS
  Administered 2023-12-13: 25 ug via INTRAVENOUS

## 2023-12-13 MED ORDER — CHLORHEXIDINE GLUCONATE 0.12 % MT SOLN
OROMUCOSAL | Status: AC
  Filled 2023-12-13: qty 15

## 2023-12-13 MED ORDER — PHENYLEPHRINE 80 MCG/ML (10ML) SYRINGE FOR IV PUSH (FOR BLOOD PRESSURE SUPPORT)
PREFILLED_SYRINGE | INTRAVENOUS | Status: DC | PRN
Start: 2023-12-13 — End: 2023-12-13
  Administered 2023-12-13 (×2): 80 ug via INTRAVENOUS

## 2023-12-13 MED ORDER — CEFAZOLIN SODIUM-DEXTROSE 2-4 GM/100ML-% IV SOLN
INTRAVENOUS | Status: AC
  Filled 2023-12-13: qty 100

## 2023-12-13 MED ORDER — ACETAMINOPHEN 500 MG PO TABS
1000.0000 mg | ORAL_TABLET | ORAL | Status: AC
  Administered 2023-12-13: 1000 mg via ORAL

## 2023-12-13 MED ORDER — OXYCODONE HCL 5 MG PO TABS
5.0000 mg | ORAL_TABLET | Freq: Once | ORAL | Status: AC | PRN
  Administered 2023-12-13: 5 mg via ORAL

## 2023-12-13 MED ORDER — BUPIVACAINE-EPINEPHRINE (PF) 0.25% -1:200000 IJ SOLN
INTRAMUSCULAR | Status: AC
  Filled 2023-12-13: qty 30

## 2023-12-13 MED ORDER — ACETAMINOPHEN 500 MG PO TABS
ORAL_TABLET | ORAL | Status: AC
  Filled 2023-12-13: qty 2

## 2023-12-13 MED ORDER — ROCURONIUM BROMIDE 100 MG/10ML IV SOLN
INTRAVENOUS | Status: DC | PRN
  Administered 2023-12-13: 50 mg via INTRAVENOUS

## 2023-12-13 MED ORDER — MIDAZOLAM HCL 2 MG/2ML IJ SOLN
INTRAMUSCULAR | Status: DC | PRN
Start: 2023-12-13 — End: 2023-12-13
  Administered 2023-12-13: 2 mg via INTRAVENOUS

## 2023-12-13 MED ORDER — PHENYLEPHRINE 80 MCG/ML (10ML) SYRINGE FOR IV PUSH (FOR BLOOD PRESSURE SUPPORT)
PREFILLED_SYRINGE | INTRAVENOUS | Status: AC
Start: 2023-12-13 — End: ?
  Filled 2023-12-13: qty 10

## 2023-12-13 MED ORDER — FENTANYL CITRATE (PF) 100 MCG/2ML IJ SOLN
INTRAMUSCULAR | Status: DC | PRN
Start: 2023-12-13 — End: 2023-12-13
  Administered 2023-12-13 (×2): 50 ug via INTRAVENOUS

## 2023-12-13 MED ORDER — DROPERIDOL 2.5 MG/ML IJ SOLN: 0.6250 mg | Freq: Once | INTRAMUSCULAR | Status: DC | PRN

## 2023-12-13 MED ORDER — CELECOXIB 200 MG PO CAPS
200.0000 mg | ORAL_CAPSULE | ORAL | Status: AC
  Administered 2023-12-13: 200 mg via ORAL

## 2023-12-13 MED ORDER — GABAPENTIN 300 MG PO CAPS
ORAL_CAPSULE | ORAL | Status: AC
  Filled 2023-12-13: qty 1

## 2023-12-13 MED ORDER — SUGAMMADEX SODIUM 200 MG/2ML IV SOLN
INTRAVENOUS | Status: DC | PRN
  Administered 2023-12-13: 200 mg via INTRAVENOUS

## 2023-12-13 MED ORDER — INDOCYANINE GREEN 25 MG IV SOLR
1.2500 mg | Freq: Once | INTRAVENOUS | Status: AC
  Administered 2023-12-13: 1.25 mg via INTRAVENOUS

## 2023-12-13 MED ORDER — LIDOCAINE HCL (CARDIAC) PF 100 MG/5ML IV SOSY
PREFILLED_SYRINGE | INTRAVENOUS | Status: DC | PRN
  Administered 2023-12-13: 100 mg via INTRAVENOUS

## 2023-12-13 MED ORDER — CEFAZOLIN SODIUM-DEXTROSE 2-4 GM/100ML-% IV SOLN
2.0000 g | INTRAVENOUS | Status: AC
  Administered 2023-12-13: 2 g via INTRAVENOUS

## 2023-12-13 MED ORDER — GABAPENTIN 300 MG PO CAPS
300.0000 mg | ORAL_CAPSULE | ORAL | Status: AC
  Administered 2023-12-13: 300 mg via ORAL

## 2023-12-13 SURGICAL SUPPLY — 43 items
BAG PRESSURE INF REUSE 3000 (BAG) IMPLANT
CLIP LIGATING HEM O LOK PURPLE (MISCELLANEOUS) ×1 IMPLANT
COVER TIP SHEARS 8 DVNC (MISCELLANEOUS) ×1 IMPLANT
DERMABOND ADVANCED .7 DNX12 (GAUZE/BANDAGES/DRESSINGS) ×1 IMPLANT
DRAPE ARM DVNC X/XI (DISPOSABLE) ×4 IMPLANT
DRAPE COLUMN DVNC XI (DISPOSABLE) ×1 IMPLANT
FORCEPS BPLR R/ABLATION 8 DVNC (INSTRUMENTS) ×1 IMPLANT
FORCEPS PROGRASP DVNC XI (FORCEP) ×1 IMPLANT
GLOVE ORTHO TXT STRL SZ7.5 (GLOVE) ×2 IMPLANT
GOWN STRL REUS W/ TWL LRG LVL3 (GOWN DISPOSABLE) ×2 IMPLANT
GOWN STRL REUS W/ TWL XL LVL3 (GOWN DISPOSABLE) ×2 IMPLANT
GRASPER SUT TROCAR 14GX15 (MISCELLANEOUS) ×1 IMPLANT
IRRIGATION STRYKERFLOW (MISCELLANEOUS) IMPLANT
IRRIGATOR STRYKERFLOW (MISCELLANEOUS) ×1 IMPLANT
IRRIGATOR SUCT 8 DISP DVNC XI (IRRIGATION / IRRIGATOR) IMPLANT
IV NS IRRIG 3000ML ARTHROMATIC (IV SOLUTION) IMPLANT
KIT PINK PAD W/HEAD ARE REST (MISCELLANEOUS) ×1 IMPLANT
KIT PINK PAD W/HEAD ARM REST (MISCELLANEOUS) ×1 IMPLANT
KIT TURNOVER KIT A (KITS) ×1 IMPLANT
LABEL OR SOLS (LABEL) ×1 IMPLANT
MANIFOLD NEPTUNE II (INSTRUMENTS) ×1 IMPLANT
NDL HYPO 22X1.5 SAFETY MO (MISCELLANEOUS) ×1 IMPLANT
NDL INSUFFLATION 14GA 120MM (NEEDLE) ×1 IMPLANT
NEEDLE HYPO 22X1.5 SAFETY MO (MISCELLANEOUS) ×1 IMPLANT
NEEDLE INSUFFLATION 14GA 120MM (NEEDLE) ×1 IMPLANT
NS IRRIG 500ML POUR BTL (IV SOLUTION) ×1 IMPLANT
PACK LAP CHOLECYSTECTOMY (MISCELLANEOUS) ×1 IMPLANT
SCISSORS MNPLR CVD DVNC XI (INSTRUMENTS) ×1 IMPLANT
SEAL UNIV 5-12 XI (MISCELLANEOUS) ×4 IMPLANT
SET TUBE SMOKE EVAC HIGH FLOW (TUBING) ×1 IMPLANT
SOL ELECTROSURG ANTI STICK (MISCELLANEOUS) ×1 IMPLANT
SOLUTION ELECTROSURG ANTI STCK (MISCELLANEOUS) ×1 IMPLANT
SPIKE FLUID TRANSFER (MISCELLANEOUS) ×1 IMPLANT
SUT MNCRL 4-0 27 PS-2 XMFL (SUTURE) ×1 IMPLANT
SUT VICRYL 0 UR6 27IN ABS (SUTURE) ×1 IMPLANT
SUTURE MNCRL 4-0 27XMF (SUTURE) ×1 IMPLANT
SYR TOOMEY IRRIG 70ML (MISCELLANEOUS) IMPLANT
SYRINGE TOOMEY IRRIG 70ML (MISCELLANEOUS) IMPLANT
SYS BAG RETRIEVAL 10MM (BASKET) ×1 IMPLANT
SYSTEM BAG RETRIEVAL 10MM (BASKET) ×1 IMPLANT
TRAP FLUID SMOKE EVACUATOR (MISCELLANEOUS) ×1 IMPLANT
TROCAR Z-THREAD FIOS 11X100 BL (TROCAR) ×1 IMPLANT
WATER STERILE IRR 500ML POUR (IV SOLUTION) ×1 IMPLANT

## 2023-12-13 NOTE — Interval H&P Note (Signed)
 History and Physical Interval Note:  12/13/2023 12:54 PM  Angie Schmidt  has presented today for surgery, with the diagnosis of Calculous of gallbladder with biliary obstruction but without cholecystitis  Abdominal pain.  The various methods of treatment have been discussed with the patient and family. After consideration of risks, benefits and other options for treatment, the patient has consented to  Procedure(s): CHOLECYSTECTOMY, ROBOT-ASSISTED, LAPAROSCOPIC (N/A) INDOCYANINE GREEN FLUORESCENCE IMAGING (ICG) (N/A) as a surgical intervention.  The patient's history has been reviewed, patient examined, no change in status, stable for surgery.  I have reviewed the patient's chart and labs.  Questions were answered to the patient's satisfaction.   MRCP reviewed:  2.9 cm stone in GB neck.  No ductal pathology, aside from minimal extrinsic deformity, no intrahepatic dilation, no CBD stones.    Campbell Lerner

## 2023-12-13 NOTE — Transfer of Care (Signed)
 Immediate Anesthesia Transfer of Care Note  Patient: Angie Schmidt  Procedure(s) Performed: CHOLECYSTECTOMY, ROBOT-ASSISTED, LAPAROSCOPIC INDOCYANINE GREEN FLUORESCENCE IMAGING (ICG)  Patient Location: PACU  Anesthesia Type:General  Level of Consciousness: drowsy  Airway & Oxygen Therapy: Patient Spontanous Breathing and Patient connected to face mask oxygen  Post-op Assessment: Report given to RN and Post -op Vital signs reviewed and stable  Post vital signs: Reviewed and stable  Last Vitals:  Vitals Value Taken Time  BP    Temp    Pulse 81 12/13/23 1442  Resp 24 12/13/23 1442  SpO2 100 % 12/13/23 1442  Vitals shown include unfiled device data.  Last Pain:  Vitals:   12/13/23 1244  TempSrc:   PainSc: 0-No pain         Complications: No notable events documented.

## 2023-12-13 NOTE — Anesthesia Procedure Notes (Signed)
 Procedure Name: Intubation Date/Time: 12/13/2023 1:28 PM  Performed by: Elisabeth Pigeon, CRNAPre-anesthesia Checklist: Patient identified, Patient being monitored, Timeout performed, Emergency Drugs available and Suction available Patient Re-evaluated:Patient Re-evaluated prior to induction Oxygen Delivery Method: Circle system utilized Preoxygenation: Pre-oxygenation with 100% oxygen Induction Type: IV induction Ventilation: Mask ventilation without difficulty Laryngoscope Size: Mac, 3 and McGrath Grade View: Grade I Tube type: Oral Tube size: 6.5 mm Number of attempts: 1 Airway Equipment and Method: Stylet Placement Confirmation: ETT inserted through vocal cords under direct vision, positive ETCO2 and breath sounds checked- equal and bilateral Secured at: 22 cm Tube secured with: Tape Dental Injury: Teeth and Oropharynx as per pre-operative assessment

## 2023-12-13 NOTE — Telephone Encounter (Signed)
 Form was filled out and faxed to [facility] Department.

## 2023-12-13 NOTE — Anesthesia Preprocedure Evaluation (Addendum)
 Anesthesia Evaluation  Patient identified by MRN, date of birth, ID band Patient awake    Reviewed: Allergy & Precautions, H&P , NPO status , Patient's Chart, lab work & pertinent test results  Airway Mallampati: II  TM Distance: >3 FB Neck ROM: full    Dental no notable dental hx.    Pulmonary asthma , former smoker   Pulmonary exam normal        Cardiovascular negative cardio ROS Normal cardiovascular exam     Neuro/Psych Seizures -, Well Controlled,   Anxiety      negative psych ROS   GI/Hepatic Neg liver ROS,GERD  ,,  Endo/Other  negative endocrine ROS    Renal/GU      Musculoskeletal   Abdominal  (+) + obese  Peds  Hematology negative hematology ROS (+)   Anesthesia Other Findings -Calculus of gallbladder with biliary obstruction but without cholecystitis     -Chronic right upper quadrant pain    Past Medical History: No date: Anemia No date: Anxiety     Comment:  NO MEDS No date: Asthma No date: Celiac disease 11/05/2018: Cholestasis during pregnancy in third trimester No date: GERD (gastroesophageal reflux disease)     Comment:  NO MEDS No date: Headache     Comment:  FREQUENT HA'S No date: Heart murmur     Comment:  no symptom No date: Motion sickness No date: Seizure [facility])     Comment:  LAST 2018 No date: Vaginal Pap smear, abnormal  Past Surgical History: 02/14/2023: COLONOSCOPY WITH PROPOFOL; N/A     Comment:  Procedure: COLONOSCOPY WITH PROPOFOL;  Surgeon: [name], MD;  Location: [facility];                Service: Endoscopy;  Laterality: N/A; No date: denies surgical history 02/14/2023: ESOPHAGOGASTRODUODENOSCOPY (EGD) WITH PROPOFOL; N/A     Comment:  Procedure: ESOPHAGOGASTRODUODENOSCOPY (EGD) WITH               PROPOFOL;  Surgeon: [name], MD;  Location:               [facility];  Service: Endoscopy;  Laterality:                N/A; No date: NO PAST SURGERIES     Reproductive/Obstetrics negative OB ROS                              Anesthesia Physical Anesthesia Plan  ASA: 2  Anesthesia Plan: General ETT   Post-op Pain Management: Tylenol PO (pre-op)*, Celebrex PO (pre-op)* and Gabapentin PO (pre-op)*   Induction: Intravenous  PONV Risk Score and Plan: 4 or greater and Ondansetron, Dexamethasone, Midazolam and Propofol infusion  Airway Management Planned: Oral ETT  Additional Equipment:   Intra-op Plan:   Post-operative Plan: Extubation in OR  Informed Consent: I have reviewed the patients History and Physical, chart, labs and discussed the procedure including the risks, benefits and alternatives for the proposed anesthesia with the patient or authorized representative who has indicated his/her understanding and acceptance.     Dental Advisory Given  Plan Discussed with: CRNA and Surgeon  Anesthesia Plan Comments:          Anesthesia Quick Evaluation

## 2023-12-13 NOTE — Anesthesia Postprocedure Evaluation (Signed)
 Anesthesia Post Note  Patient: Angie Schmidt  Procedure(s) Performed: CHOLECYSTECTOMY, ROBOT-ASSISTED, LAPAROSCOPIC INDOCYANINE GREEN FLUORESCENCE IMAGING (ICG)  Patient location during evaluation: PACU Anesthesia Type: General Level of consciousness: awake and alert Pain management: pain level controlled Vital Signs Assessment: post-procedure vital signs reviewed and stable Respiratory status: spontaneous breathing, nonlabored ventilation, respiratory function stable and patient connected to nasal cannula oxygen Cardiovascular status: blood pressure returned to baseline and stable Postop Assessment: no apparent nausea or vomiting Anesthetic complications: no  No notable events documented.   Last Vitals:  Vitals:   12/13/23 1453 12/13/23 1500  BP:  105/60  Pulse: 81 77  Resp: 20 16  Temp:    SpO2: 97% 96%    Last Pain:  Vitals:   12/13/23 1500  TempSrc:   PainSc: 8                  Stephanie Coup

## 2023-12-13 NOTE — Op Note (Signed)
 Robotic cholecystectomy with Indocyamine Green Ductal Imaging.   Pre-operative Diagnosis: Chronic calculus cholecystitis  Post-operative Diagnosis:  Same, subacute with Hydrops.   Procedure: Robotic assisted laparoscopic cholecystectomy with Indocyamine Green Ductal Imaging.   Surgeon: Campbell Lerner, M.D., FACS  Anesthesia: General. with endotracheal tube  Findings: large stone as expected lodged in neck of GB, clear bile.   Estimated Blood Loss: 10 mL         Drains: None         Specimens: Gallbladder           Complications: none  Procedure Details  The patient was seen again in the Holding Room.  1.25 mg dose of ICG was administered intravenously.   The benefits, complications, treatment options, risks and expected outcomes were again reviewed with the patient. The likelihood of improving the patient's symptoms with return to their baseline status is good.  The patient and/or family concurred with the proposed plan, giving informed consent, again alternatives reviewed.  The patient was taken to Operating Room, identified, and the procedure verified as robotic assisted laparoscopic cholecystectomy.  Prior to the induction of general anesthesia, antibiotic prophylaxis was administered. VTE prophylaxis was in place. General endotracheal anesthesia was then administered and tolerated well. The patient was positioned in the supine position.  After the induction, the abdomen was prepped with Chloraprep and draped in the sterile fashion.  A Time Out was held and the above information confirmed.  After local infiltration of quarter percent Marcaine with epinephrine, stab incision was made left upper quadrant.  Just below the costal margin at Palmer's point, approximately midclavicular line the Veres needle is passed with sensation of the layers to penetrate the abdominal wall and into the peritoneum.  Saline drop test is confirmed peritoneal placement.  Insufflation is initiated with  carbon dioxide to pressures of 15 mmHg.  Local infiltration with 0.25% Marcaine with epinephrine is utilized for all skin incisions.  Made a 12 mm incision on the right periumbilical site, I advanced an optical 11mm port under direct visualization into the peritoneal cavity.  Once the peritoneum was penetrated, insufflation was initiated.  The trocar was then advanced into the abdominal cavity under direct visualization. Pneumoperitoneum was then continued utilizing CO2 at 15 mmHg or less and tolerated well without any adverse changes in the patient's vital signs.  Two 8.5-mm ports were placed in the left lower quadrant and laterally, and one to the right lower quadrant, all under direct vision. Local infiltration with a mixture of Exparel and 0.25% Marcaine with epinephrine is utilized for all port sites with deep infiltration under visualization.   The patient was positioned  in reverse Trendelenburg, tilted the patient's left side down.  Da Vinci XI robot was then positioned on to the patient's left side, and docked.  The gallbladder was identified, the fundus grasped via the arm 4 Prograsp and retracted cephalad. Adhesions were lysed with scissors and cautery.  The infundibulum was identified grasped and retracted laterally, exposing the peritoneum overlying the triangle of Calot. This was then opened and dissected using cautery & scissors.  We did achieve the critical view of safety with 2 and only 2 structures entering the gallbladder and we were able to see the liver plate posterior to this from both sides;with the extended critical view of the cystic duct and cystic artery obtained, aided by the ICG via FireFly which improved localization of the major ductal anatomy.    The cystic duct was clearly identified and dissected to  isolation.   Artery well isolated and clipped, and the cystic duct was triple clipped and divided with scissors, as close to the gallbladder neck as feasible, thus leaving two  on the remaining stump.  The specimen side of the artery is sealed with bipolar and divided with monopolar scissors.   The gallbladder was taken from the gallbladder fossa in a retrograde fashion with the electrocautery. The gallbladder was removed and placed in an Endocatch bag.  The liver bed is inspected. Hemostasis was confirmed.  The robot was undocked and moved away from the operative field. 1 liter NSS irrigation was utilized and was aspirated clear.  The gallbladder and Endocatch sac were then removed through the paraumbilical port site.   Inspection of the right upper quadrant was performed. No bleeding, bile duct injury or leak, or bowel injury was noted. The infra-umbilical port site fascia was closed with interrumpted 0 Vicryl sutures using PMI/cone under direct visualization. Pneumoperitoneum was released and ports removed.  4-0 subcuticular Monocryl was used to close the skin. Dermabond was  applied.  The patient was then extubated and brought to the recovery room in stable condition. Sponge, lap, and needle counts were correct at closure and at the conclusion of the case.               Campbell Lerner, M.D., [facility] 12/13/2023 2:38 PM

## 2023-12-14 LAB — CYTOLOGY - PAP
Comment: NEGATIVE
Diagnosis: NEGATIVE
High risk HPV: NEGATIVE

## 2023-12-14 LAB — SURGICAL PATHOLOGY

## 2023-12-14 NOTE — Progress Notes (Signed)
 noted

## 2023-12-15 ENCOUNTER — Other Ambulatory Visit: Payer: Self-pay | Admitting: Family

## 2023-12-15 DIAGNOSIS — B9689 Other specified bacterial agents as the cause of diseases classified elsewhere: Secondary | ICD-10-CM

## 2023-12-15 MED ORDER — METRONIDAZOLE 500 MG PO TABS: 500.0000 mg | ORAL_TABLET | Freq: Two times a day (BID) | ORAL | 0 refills | Status: AC

## 2023-12-20 DIAGNOSIS — F331 Major depressive disorder, recurrent, moderate: Secondary | ICD-10-CM | POA: Diagnosis not present

## 2023-12-27 ENCOUNTER — Encounter: Admitting: Physician Assistant

## 2023-12-28 ENCOUNTER — Encounter: Payer: Self-pay | Admitting: Surgery

## 2023-12-28 ENCOUNTER — Ambulatory Visit (INDEPENDENT_AMBULATORY_CARE_PROVIDER_SITE_OTHER): Admitting: Surgery

## 2023-12-28 VITALS — BP 110/70 | HR 86 | Ht 60.0 in | Wt 187.0 lb

## 2023-12-28 DIAGNOSIS — K8010 Calculus of gallbladder with chronic cholecystitis without obstruction: Secondary | ICD-10-CM

## 2023-12-28 DIAGNOSIS — Z09 Encounter for follow-up examination after completed treatment for conditions other than malignant neoplasm: Secondary | ICD-10-CM

## 2023-12-28 DIAGNOSIS — Z9049 Acquired absence of other specified parts of digestive tract: Secondary | ICD-10-CM | POA: Insufficient documentation

## 2023-12-28 MED ORDER — ONDANSETRON HCL 4 MG PO TABS: 4.0000 mg | ORAL_TABLET | Freq: Three times a day (TID) | ORAL | 0 refills | Status: DC | PRN

## 2023-12-28 NOTE — Patient Instructions (Addendum)
 We have sent you in a prescription for Zofran for your nausea.  GENERAL POST-OPERATIVE PATIENT INSTRUCTIONS   WOUND CARE INSTRUCTIONS: Try to keep the wound dry and avoid ointments on the wound unless directed to do so.  If the wound becomes bright red and painful or starts to drain infected material that is not clear, please contact your physician immediately.  If the wound is mildly pink and has a thick firm ridge underneath it, this is normal, and is referred to as a healing ridge.  This will resolve over the next 4-6 weeks.  BATHING: You may shower if you have been informed of this by your surgeon. However, Please do not submerge in a tub, hot tub, or pool until incisions are completely sealed or have been told by your surgeon that you may do so.  DIET:  You may eat any foods that you can tolerate.  It is a good idea to eat a high fiber diet and take in plenty of fluids to prevent constipation.  If you do become constipated you may want to take a mild laxative or take ducolax tablets on a daily basis until your bowel habits are regular.  Constipation can be very uncomfortable, along with straining, after recent surgery.  ACTIVITY: You are encouraged to walk and engage in light activity for the next two weeks.  You should not lift more than 20 pounds for 6 weeks total after surgery as it could put you at increased risk for complications. Twenty pounds is roughly equivalent to a plastic bag of groceries. At that time- Listen to your body when lifting, if you have pain when lifting, stop and then try again in a few days. Soreness after doing exercises or activities of daily living is normal as you get back in to your normal routine.  MEDICATIONS:  Try to take narcotic medications and anti-inflammatory medications, such as tylenol, ibuprofen, naprosyn, etc., with food.  This will minimize stomach upset from the medication.  Should you develop nausea and vomiting from the pain medication, or develop a  rash, please discontinue the medication and contact your physician.  You should not drive, make important decisions, or operate machinery when taking narcotic pain medication.  SUNBLOCK Use sun block to incision area over the next year if this area will be exposed to sun. This helps decrease scarring and will allow you avoid a permanent darkened area over your incision.  QUESTIONS:  Please feel free to call our office if you have any questions, and we will be glad to assist you. [phone]

## 2023-12-28 NOTE — Progress Notes (Signed)
 [facility] POST-OP OFFICE VISIT  12/28/2023  HPI: Angie Schmidt is a 38 y.o. female had surgery on December 13, 2023, now s/p robotic cholecystectomy.  She had a single large gallstone, with associated ulceration is noted on pathology.  All she has is some mild nausea when laying down at night.  Seems to be the only time it bothers her.  She is taken Zofran in the past when she was pregnant, does not recall how helpful it was but is willing to give it a try again.  Thinks it might be secondary to her celiac disease.  Most painful area is the extraction site near her umbilicus, she has a very physical job and she has deep fascial sutures there.    Vital signs: BP 110/70   Pulse 86   Ht 5' (1.524 m)   Wt 187 lb (84.8 kg)   LMP 12/13/2023 (Exact Date)   SpO2 98%   BMI 36.52 kg/m    Physical Exam: Constitutional: She appears quite well. Abdomen: Soft benign nontender.  No masses, no induration, no bruising. Skin: Dermabond intact on every incision.  All clean and dry.  Assessment/Plan: This is a 38 y.o. female status post robotic cholecystectomy from December 13, 2023.  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   Abnormal liver function tests 12/07/2023   Abdominal pain, chronic, right upper quadrant 12/07/2023   Obesity (BMI 35.0-39.9 without comorbidity) 12/05/2023   Memory changes 12/05/2023   Family history of diabetes mellitus 12/05/2023   Facial rash 12/05/2023   Celiac disease 08/15/2023   B12 deficiency 02/13/2023   Iron deficiency anemia 07/04/2022   Abnormal Pap smear of cervix 04/26/21 ASCUS HPV+ 04/29/2021   Seizures (HCC)--last seizure age 38 04/26/2021   Marijuana use 04/26/2021    -Due to the physical nature of her job with [employer], she will need to give it a full 6 weeks of healing to avoid potential development of incisional hernia.   Will send in a prescription for some ODT Zofran. And I will be glad to see her back should we be of any further help in this very pleasant patient.   Campbell Lerner M.D., FACS 12/28/2023, 10:27 AM

## 2024-01-03 DIAGNOSIS — F331 Major depressive disorder, recurrent, moderate: Secondary | ICD-10-CM | POA: Diagnosis not present

## 2024-01-08 ENCOUNTER — Inpatient Hospital Stay: Payer: Medicaid Other

## 2024-01-08 ENCOUNTER — Inpatient Hospital Stay: Payer: Medicaid Other | Attending: Internal Medicine

## 2024-01-08 DIAGNOSIS — E538 Deficiency of other specified B group vitamins: Secondary | ICD-10-CM | POA: Diagnosis not present

## 2024-01-08 DIAGNOSIS — D500 Iron deficiency anemia secondary to blood loss (chronic): Secondary | ICD-10-CM

## 2024-01-08 DIAGNOSIS — D508 Other iron deficiency anemias: Secondary | ICD-10-CM | POA: Diagnosis not present

## 2024-01-08 LAB — CBC WITH DIFFERENTIAL (CANCER CENTER ONLY)
Abs Immature Granulocytes: 0.04 10*3/uL (ref 0.00–0.07)
Basophils Absolute: 0 10*3/uL (ref 0.0–0.1)
Basophils Relative: 1 %
Eosinophils Absolute: 0.2 10*3/uL (ref 0.0–0.5)
Eosinophils Relative: 2 %
HCT: 37 % (ref 36.0–46.0)
Hemoglobin: 12.4 g/dL (ref 12.0–15.0)
Immature Granulocytes: 1 %
Lymphocytes Relative: 23 %
Lymphs Abs: 2 10*3/uL (ref 0.7–4.0)
MCH: 29.6 pg (ref 26.0–34.0)
MCHC: 33.5 g/dL (ref 30.0–36.0)
MCV: 88.3 fL (ref 80.0–100.0)
Monocytes Absolute: 0.4 10*3/uL (ref 0.1–1.0)
Monocytes Relative: 5 %
Neutro Abs: 6 10*3/uL (ref 1.7–7.7)
Neutrophils Relative %: 68 %
Platelet Count: 356 10*3/uL (ref 150–400)
RBC: 4.19 MIL/uL (ref 3.87–5.11)
RDW: 12.3 % (ref 11.5–15.5)
WBC Count: 8.7 10*3/uL (ref 4.0–10.5)
nRBC: 0 % (ref 0.0–0.2)

## 2024-01-08 LAB — IRON AND TIBC
Iron: 73 ug/dL (ref 28–170)
Saturation Ratios: 23 % (ref 10.4–31.8)
TIBC: 319 ug/dL (ref 250–450)
UIBC: 246 ug/dL

## 2024-01-08 LAB — VITAMIN B12: Vitamin B-12: 260 pg/mL (ref 180–914)

## 2024-01-08 LAB — FERRITIN: Ferritin: 104 ng/mL (ref 11–307)

## 2024-01-08 MED ORDER — CYANOCOBALAMIN 1000 MCG/ML IJ SOLN
1000.0000 ug | Freq: Once | INTRAMUSCULAR | Status: AC
Start: 2024-01-08 — End: 2024-01-08
  Administered 2024-01-08: 1000 ug via INTRAMUSCULAR
  Filled 2024-01-08: qty 1

## 2024-01-26 ENCOUNTER — Encounter: Payer: Self-pay | Admitting: Surgery

## 2024-01-30 ENCOUNTER — Encounter: Payer: Self-pay | Admitting: Family

## 2024-02-05 ENCOUNTER — Encounter: Payer: Self-pay | Admitting: Family

## 2024-02-05 ENCOUNTER — Ambulatory Visit: Admitting: Family

## 2024-02-05 VITALS — BP 124/82 | HR 68 | Temp 98.4°F | Ht 60.0 in | Wt 190.4 lb

## 2024-02-05 DIAGNOSIS — Z9049 Acquired absence of other specified parts of digestive tract: Secondary | ICD-10-CM

## 2024-02-05 NOTE — Progress Notes (Signed)
 Established Patient Office Visit  Subjective:      CC:  Chief Complaint  Patient presents with   Medical Management of Chronic Issues    Needs a note to return to work    HPI: Angie Schmidt is a 38 y.o. female presenting on 02/05/2024 for Medical Management of Chronic Issues (Needs a note to return to work)  She needs paperwork to return to work and she needs paperwork for the days she was out of work.   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 The company is called [employer]. Her job duties entailed lifting heavy packages, going up stairs, bending often to lift items etc. She was out for a period of time from work due to dilated gallbladder with stone impacted in neck of gallbladder. She had this u/s where we found the initial issue on 3/5 due to elevated LFTS on blood work to prompt the ordered u/s. She then was urgently referred to general surgery, Dr.  Ofilia Benton where she had a cholecystectomy laparoscopically.      Social history:  Relevant past medical, surgical, family and social history reviewed and updated as indicated. Interim medical history since our last visit reviewed.  Allergies and medications reviewed and updated.  DATA REVIEWED: CHART IN EPIC     ROS: Negative unless specifically indicated above in HPI.    Current Outpatient Medications:    hydrOXYzine  (VISTARIL ) 25 MG capsule, Take 1 capsule (25 mg total) by mouth every 8 (eight) hours as needed., Disp: 30 capsule, Rfl: 0   ondansetron  (ZOFRAN ) 4 MG tablet, Take 1 tablet (4 mg total) by mouth every 8 (eight) hours as needed for nausea or vomiting., Disp: 20 tablet, Rfl: 0   VENTOLIN  HFA 108 (90 Base) MCG/ACT inhaler, Inhale 2 puffs into the lungs every 6 (six) hours as needed for shortness of breath or wheezing., Disp: , Rfl:    norethindrone  (ORTHO MICRONOR ) 0.35 MG tablet, Take 1 tablet (0.35 mg total) by mouth daily. (Patient not  taking: Reported on 02/05/2024), Disp: 28 tablet, Rfl: 11      Objective:    BP 124/82 (BP Location: Left Arm, Patient Position: Sitting, Cuff Size: Normal)   Pulse 68   Temp 98.4 F (36.9 C) (Temporal)   Ht 5' (1.524 m)   Wt 190 lb 6.4 oz (86.4 kg)   SpO2 98%   BMI 37.18 kg/m   Wt Readings from Last 3 Encounters:  02/05/24 190 lb 6.4 oz (86.4 kg)  12/28/23 187 lb (84.8 kg)  12/13/23 185 lb (83.9 kg)    Physical Exam Constitutional:      General: She is not in acute distress.    Appearance: Normal appearance. She is not ill-appearing, toxic-appearing or diaphoretic.  HENT:     Head: Normocephalic.  Cardiovascular:     Rate and Rhythm: Normal rate and regular rhythm.  Pulmonary:     Effort: Pulmonary effort is normal.  Musculoskeletal:        General: Normal range of motion.  Skin:         Comments: Healed incisions x 5  Neurological:     General: No focal deficit present.     Mental Status: She is alert and oriented to person, place, and time. Mental status is at baseline.  Psychiatric:        Mood and Affect: Mood normal.        Behavior: Behavior normal.  Thought Content: Thought content normal.        Judgment: Judgment normal.           Assessment & Plan:  Status post laparoscopic cholecystectomy Assessment & Plan: F/u with surgeon in regards to returning to work date, and if any light restrictions are to be in place.  We will fill out FMLA for time out from urgent surgery that was medically necessary.  Will await dates from pt start to end.        Return if symptoms worsen or fail to improve.  Felicita Horns, MSN, APRN, FNP-C [facility]

## 2024-02-05 NOTE — Patient Instructions (Addendum)
  Ask your HR department if you can get FMLA?  If yes, ask for paperwork and then get to me.   As far as the return to work note, reach out again to Dr Roxan Copes office.  Let them know you are weak when lifting some things since the surgery such as struggling to pick up her son who is 50 pounds. Light restrictions? Can you get a note that states simply that you can return to work and of which date? And or what paperwork do you need to drop off at their office.   I will need to the return to work dates as well as if he is putting you on limitations.

## 2024-02-05 NOTE — Assessment & Plan Note (Signed)
 F/u with surgeon in regards to returning to work date, and if any light restrictions are to be in place.  We will fill out FMLA for time out from urgent surgery that was medically necessary.  Will await dates from pt start to end.

## 2024-02-14 ENCOUNTER — Encounter: Payer: Self-pay | Admitting: Neurology

## 2024-02-14 ENCOUNTER — Ambulatory Visit: Admitting: Neurology

## 2024-02-14 VITALS — BP 116/77 | HR 69 | Ht 60.0 in | Wt 193.8 lb

## 2024-02-14 DIAGNOSIS — R569 Unspecified convulsions: Secondary | ICD-10-CM | POA: Diagnosis not present

## 2024-02-14 NOTE — Patient Instructions (Signed)
 Routine EEG, If normal, will continue observe patient and will obtain an ambulatory EEG if she does have another event If EEG is abnormal we will most likely start patient on antiseizure medication Continue your other medications Return as needed.

## 2024-02-14 NOTE — Progress Notes (Signed)
 [facility]  PATIENT: Angie Schmidt DOB: [birth year]  REQUESTING CLINICIAN: Felicita Horns, FNP HISTORY FROM: Patient  REASON FOR VISIT: History of seizure last one 2017   HISTORICAL  CHIEF COMPLAINT:  Chief Complaint  Patient presents with   New Patient (Initial Visit)    RM 12, alone.  Referral for hx seizures, transient amnesia.  Had seizures as a child. As adult, has not had seizure since 2016 that she knows of.  Unsure of last EEG for sure. Thinks when she was a child.     HISTORY OF PRESENT ILLNESS:  This is a 38 year old woman past with medical history of seizure disorder, anemia, celiac disease who is presenting for management of her seizure.  She tells me that she has a history of childhood seizures, described as generalized convulsion.  She tells me her episodes were very sporadic she can have 1 seizure in a year and the next 1 will be in 2 to 3 years.  She has never been worked up for these events and was never on antiseizure medication.  She tells me that her last event was in 2017 when she was with her best friend and had a generalized convulsion.  Since then she has not had a seizure. One of other complaint today is poor memory, reports that she is very forgetful, possibly related to her previous history of seizure.   Handedness: Right hand   Onset: Since childhood  Seizure Type: Described as generalized convulsion   Current frequency: Last one in 2017  Any injuries from seizures: Denies   Seizure risk factors: Mother  Previous ASMs: None   Currenty ASMs: None  ASMs side effects: N/A  Brain Images: Normal head CT   Previous EEGs: Not available for review    OTHER MEDICAL CONDITIONS: Celiac disease, anemia  REVIEW OF SYSTEMS: Full 14 system review of systems performed and negative with exception of: As noted in the HPI  ALLERGIES: Allergies  Allergen Reactions   Wellbutrin [Bupropion] Other (See Comments)    H/o  seizures Will lower seizure threshold    HOME MEDICATIONS: Outpatient Medications Prior to Visit  Medication Sig Dispense Refill   hydrOXYzine  (VISTARIL ) 25 MG capsule Take 1 capsule (25 mg total) by mouth every 8 (eight) hours as needed. 30 capsule 0   ondansetron  (ZOFRAN ) 4 MG tablet Take 1 tablet (4 mg total) by mouth every 8 (eight) hours as needed for nausea or vomiting. 20 tablet 0   VENTOLIN  HFA 108 (90 Base) MCG/ACT inhaler Inhale 2 puffs into the lungs every 6 (six) hours as needed for shortness of breath or wheezing.     norethindrone  (ORTHO MICRONOR ) 0.35 MG tablet Take 1 tablet (0.35 mg total) by mouth daily. (Patient not taking: Reported on 02/05/2024) 28 tablet 11   No facility-administered medications prior to visit.    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   Celiac disease    Cholestasis during pregnancy in third trimester 11/05/2018   GERD (gastroesophageal reflux disease)    NO MEDS   Headache    FREQUENT HA'S   Heart murmur    no symptom   Motion sickness    Seizure (HCC)    LAST 2018   Vaginal Pap smear, abnormal     PAST SURGICAL HISTORY: Past Surgical History:  Procedure Laterality Date  COLONOSCOPY WITH PROPOFOL  N/A 02/14/2023   Procedure: COLONOSCOPY WITH PROPOFOL ;  Surgeon: Selena Daily, MD;  Location: [facility];  Service: Endoscopy;  Laterality: N/A;   ESOPHAGOGASTRODUODENOSCOPY (EGD) WITH PROPOFOL  N/A 02/14/2023   Procedure: ESOPHAGOGASTRODUODENOSCOPY (EGD) WITH PROPOFOL ;  Surgeon: Selena Daily, MD;  Location: [facility];  Service: Endoscopy;  Laterality: N/A;   GALLBLADDER SURGERY      FAMILY HISTORY: Family History  Problem Relation Age of Onset   Diabetes Paternal Grandfather    Diabetes Paternal Grandmother    Cancer Maternal Grandmother    Diabetes Maternal Grandmother    Lung disease  Maternal Grandmother    Diabetes Maternal Grandfather    Hypertension Maternal Grandfather    Diabetes Father    Cancer Mother    Diabetes Mother    Hypertension Mother    Seizures Mother    Liver disease Mother    Clotting disorder Mother     SOCIAL HISTORY: Social History   Socioeconomic History   Marital status: Single    Spouse name: Not on file   Number of children: 4   Years of education: 8   Highest education level: 8th grade  Occupational History   Not on file  Tobacco Use   Smoking status: Former    Current packs/day: 0.00    Average packs/day: 0.3 packs/day for 17.0 years (4.3 ttl pk-yrs)    Types: Cigarettes    Start date: 09/10/2000    Quit date: 09/10/2017    Years since quitting: 6.4    Passive exposure: Never   Smokeless tobacco: Never  Vaping Use   Vaping status: Never Used  Substance and Sexual Activity   Alcohol use: Yes    Alcohol/week: 3.0 standard drinks of alcohol    Types: 3 Glasses of wine per week    Comment: occasionally   Drug use: Yes    Types: Marijuana    Comment: daily   Sexual activity: Yes    Partners: Male    Birth control/protection: None  Other Topics Concern   Not on file  Social History Narrative   Right handed   Lives at home with children   Caffeine use: tea/soda sometimes   Social Drivers of Corporate investment banker Strain: Not on file  Food Insecurity: Not on file  Transportation Needs: Not on file  Physical Activity: Not on file  Stress: Not on file  Social Connections: Unknown (06/04/2020)   [facility], [facility]   Social Connections    Frequency of Communication with Friends and Family: Not asked    Frequency of Social Gatherings with Friends and Family: Not asked  Intimate Partner Violence: Not At Risk (04/26/2021)   Humiliation, Afraid, Rape, and Kick questionnaire    Fear of Current or Ex-Partner: No    Emotionally Abused: No    Physically Abused: No    Sexually Abused: No     PHYSICAL EXAM  GENERAL EXAM/CONSTITUTIONAL: Vitals:  Vitals:   02/14/24 1257  BP: 116/77  Pulse: 69  Weight: 193 lb 12.8 oz (87.9 kg)  Height: 5' (1.524 m)   Body mass index is 37.85 kg/m. Wt Readings from Last 3 Encounters:  02/14/24 193 lb 12.8 oz (87.9 kg)  02/05/24 190 lb 6.4 oz (86.4 kg)  12/28/23 187 lb (84.8 kg)   Patient is in no distress; well developed, nourished and groomed; neck is supple  MUSCULOSKELETAL: Gait, strength, tone, movements noted in Neurologic exam below  NEUROLOGIC: MENTAL STATUS:  No data to display         awake, alert, oriented to person, place and time recent and remote memory intact normal attention and concentration language fluent, comprehension intact, naming intact fund of knowledge appropriate  CRANIAL NERVE:  2nd, 3rd, 4th, 6th - Visual fields full to confrontation, extraocular muscles intact, no nystagmus 5th - facial sensation symmetric 7th - facial strength symmetric 8th - hearing intact 9th - palate elevates symmetrically, uvula midline 11th - shoulder shrug symmetric 12th - tongue protrusion midline  MOTOR:  normal bulk and tone, full strength in the BUE, BLE  SENSORY:  normal and symmetric to light touch  COORDINATION:  finger-nose-finger, fine finger movements normal  GAIT/STATION:  normal   DIAGNOSTIC DATA (LABS, IMAGING, TESTING) - I reviewed patient records, labs, notes, testing and imaging myself where available.  Lab Results  Component Value Date   WBC 8.7 01/08/2024   HGB 12.4 01/08/2024   HCT 37.0 01/08/2024   MCV 88.3 01/08/2024   PLT 356 01/08/2024      Component Value Date/Time   NA 139 12/13/2023 1220   NA 139 03/12/2012 1336   K 3.7 12/13/2023 1220   K 3.8 03/12/2012 1336   CL 105 12/13/2023 1220   CL 105 03/12/2012 1336   CO2 25 12/13/2023 1220   CO2 28 03/12/2012 1336   GLUCOSE 93 12/13/2023 1220   GLUCOSE 105 (H) 03/12/2012 1336   BUN 14 12/13/2023 1220   BUN 9  03/12/2012 1336   CREATININE 0.69 12/13/2023 1220   CREATININE 0.68 03/12/2012 1336   CALCIUM 8.8 (L) 12/13/2023 1220   CALCIUM 8.8 03/12/2012 1336   PROT 7.2 12/13/2023 1220   PROT 7.8 03/12/2012 1336   ALBUMIN 3.7 12/13/2023 1220   ALBUMIN 3.6 03/12/2012 1336   AST 19 12/13/2023 1220   AST 23 03/12/2012 1336   ALT 35 12/13/2023 1220   ALT 38 03/12/2012 1336   ALKPHOS 106 12/13/2023 1220   ALKPHOS 133 03/12/2012 1336   BILITOT 1.0 12/13/2023 1220   BILITOT 0.4 03/12/2012 1336   GFRNONAA >60 12/13/2023 1220   GFRNONAA >60 03/12/2012 1336   GFRAA >60 11/05/2018 1731   GFRAA >60 03/12/2012 1336   No results found for: "CHOL", "HDL", "LDLCALC", "LDLDIRECT", "TRIG" Lab Results  Component Value Date   HGBA1C 5.7 12/05/2023   Lab Results  Component Value Date   VITAMINB12 260 01/08/2024   Lab Results  Component Value Date   TSH 1.60 12/05/2023    Head CT 2013 No acute intracranial abnormality     ASSESSMENT AND PLAN  38 y.o. year old female  with history of celiac disease, anemia, history of seizures who is presenting to establish care.  She tells me her seizures are generalized convulsions, last one was 2017.  Currently she is complaining more of memory loss, lapse of memory, wondering if this is related to her history of seizure.  Plan will be to obtain an EEG for background classification, if EEG is normal, we will continue to observe patient and if she does have another event of loss of consciousness, will obtain a 3-day ambulatory EEG.  If any abnormality with the routine EEG, will most likely start patient on antiseizure medication.  This was discussed with patient and she is comfortable with plans.  I will see her as needed.   1. Seizures (HCC)     Patient Instructions  Routine EEG, If normal, will continue observe patient and will obtain an ambulatory EEG if  she does have another event If EEG is abnormal we will most likely start patient on antiseizure  medication Continue your other medications Return as needed.   Per [place]  DMV statutes, patients with seizures are not allowed to drive until they have been seizure-free for six months.  Other recommendations include using caution when using heavy equipment or power tools. Avoid working on ladders or at heights. Take showers instead of baths.  Do not swim alone.  Ensure the water  temperature is not too high on the home water  heater. Do not go swimming alone. Do not lock yourself in a room alone (i.e. bathroom). When caring for infants or small children, sit down when holding, feeding, or changing them to minimize risk of injury to the child in the event you have a seizure. Maintain good sleep hygiene. Avoid alcohol.  Also recommend adequate sleep, hydration, good diet and minimize stress.   During the Seizure  - First, ensure adequate ventilation and place patients on the floor on their left side  Loosen clothing around the neck and ensure the airway is patent. If the patient is clenching the teeth, do not force the mouth open with any object as this can cause severe damage - Remove all items from the surrounding that can be hazardous. The patient may be oblivious to what's happening and may not even know what he or she is doing. If the patient is confused and wandering, either gently guide him/her away and block access to outside areas - Reassure the individual and be comforting - Call 911. In most cases, the seizure ends before EMS arrives. However, there are cases when seizures may last over 3 to 5 minutes. Or the individual may have developed breathing difficulties or severe injuries. If a pregnant patient or a person with diabetes develops a seizure, it is prudent to call an ambulance. - Finally, if the patient does not regain full consciousness, then call EMS. Most patients will remain confused for about 45 to 90 minutes after a seizure, so you must use judgment in calling for help. -  Avoid restraints but make sure the patient is in a bed with padded side rails - Place the individual in a lateral position with the neck slightly flexed; this will help the saliva drain from the mouth and prevent the tongue from falling backward - Remove all nearby furniture and other hazards from the area - Provide verbal assurance as the individual is regaining consciousness - Provide the patient with privacy if possible - Call for help and start treatment as ordered by the caregiver   After the Seizure (Postictal Stage)  After a seizure, most patients experience confusion, fatigue, muscle pain and/or a headache. Thus, one should permit the individual to sleep. For the next few days, reassurance is essential. Being calm and helping reorient the person is also of importance.  Most seizures are painless and end spontaneously. Seizures are not harmful to others but can lead to complications such as stress on the lungs, brain and the heart. Individuals with prior lung problems may develop labored breathing and respiratory distress.    Discussed Patients with epilepsy have a small risk of sudden unexpected death, a condition referred to as sudden unexpected death in epilepsy (SUDEP). SUDEP is defined specifically as the sudden, unexpected, witnessed or unwitnessed, nontraumatic and nondrowning death in patients with epilepsy with or without evidence for a seizure, and excluding documented status epilepticus, in which post mortem examination does not reveal a structural or  toxicologic cause for death     Orders Placed This Encounter  Procedures   EEG adult    No orders of the defined types were placed in this encounter.   Return if symptoms worsen or fail to improve.    Cassandra Cleveland, MD 02/14/2024, 1:41 PM  [facility] [address] [place], [place] [address] [phone]

## 2024-02-15 ENCOUNTER — Ambulatory Visit: Payer: Self-pay | Admitting: Neurology

## 2024-02-15 ENCOUNTER — Ambulatory Visit: Admitting: Neurology

## 2024-02-15 DIAGNOSIS — R569 Unspecified convulsions: Secondary | ICD-10-CM | POA: Diagnosis not present

## 2024-02-15 NOTE — Procedures (Signed)
   History:  38 year old woman with seizure   EEG classification:  Awake and asleep  Duration: 27 minutes   Technical aspects: This EEG study was done with scalp electrodes positioned according to the 10-20 International system of electrode placement. Electrical activity was reviewed with band pass filter of 1-70Hz , sensitivity of 7 uV/mm, display speed of 31mm/sec with a 60Hz  notched filter applied as appropriate. EEG data were recorded continuously and digitally stored.   Description of the recording: The background rhythms of this recording consists of a fairly well modulated medium amplitude background activity of 10 Hz. As the record progresses, the patient initially is in the waking state, but appears to enter the early stage II sleep during the recording, with rudimentary sleep spindles and vertex sharp wave activity seen. During the wakeful state, photic stimulation was performed, and no abnormal responses were seen. Hyperventilation was also performed, no abnormal response seen. No epileptiform discharges seen during this recording. There was no focal slowing.   Abnormality: None   Impression: This is a normal awake and sleep EEG. No evidence of interictal epileptiform discharges. Normal EEGs, however, do not rule out epilepsy.    Jayelle Page, MD [facility]

## 2024-03-01 ENCOUNTER — Encounter: Payer: Self-pay | Admitting: Family

## 2024-04-04 ENCOUNTER — Other Ambulatory Visit: Payer: Self-pay

## 2024-04-04 DIAGNOSIS — D500 Iron deficiency anemia secondary to blood loss (chronic): Secondary | ICD-10-CM

## 2024-04-08 ENCOUNTER — Ambulatory Visit: Payer: Medicaid Other | Admitting: Internal Medicine

## 2024-04-08 ENCOUNTER — Other Ambulatory Visit: Payer: Medicaid Other

## 2024-04-08 ENCOUNTER — Inpatient Hospital Stay: Payer: Self-pay | Attending: Internal Medicine

## 2024-04-08 ENCOUNTER — Ambulatory Visit: Payer: Medicaid Other

## 2024-04-08 ENCOUNTER — Encounter: Payer: Self-pay | Admitting: Oncology

## 2024-04-08 ENCOUNTER — Inpatient Hospital Stay: Payer: Self-pay

## 2024-04-08 ENCOUNTER — Encounter: Payer: Self-pay | Admitting: Internal Medicine

## 2024-04-08 ENCOUNTER — Inpatient Hospital Stay: Payer: Self-pay | Admitting: Oncology

## 2024-05-10 ENCOUNTER — Encounter: Payer: Self-pay | Admitting: Oncology

## 2024-05-10 ENCOUNTER — Inpatient Hospital Stay: Payer: Self-pay | Attending: Internal Medicine

## 2024-05-10 ENCOUNTER — Ambulatory Visit: Payer: Self-pay | Admitting: Oncology

## 2024-05-10 ENCOUNTER — Inpatient Hospital Stay: Payer: Self-pay

## 2024-05-10 ENCOUNTER — Inpatient Hospital Stay (HOSPITAL_BASED_OUTPATIENT_CLINIC_OR_DEPARTMENT_OTHER): Payer: Self-pay | Admitting: Oncology

## 2024-05-10 VITALS — BP 116/78 | HR 77 | Temp 97.0°F | Resp 18 | Ht 60.0 in | Wt 190.4 lb

## 2024-05-10 DIAGNOSIS — D500 Iron deficiency anemia secondary to blood loss (chronic): Secondary | ICD-10-CM

## 2024-05-10 DIAGNOSIS — D519 Vitamin B12 deficiency anemia, unspecified: Secondary | ICD-10-CM | POA: Insufficient documentation

## 2024-05-10 DIAGNOSIS — Z87891 Personal history of nicotine dependence: Secondary | ICD-10-CM | POA: Insufficient documentation

## 2024-05-10 DIAGNOSIS — E538 Deficiency of other specified B group vitamins: Secondary | ICD-10-CM

## 2024-05-10 LAB — CBC WITH DIFFERENTIAL (CANCER CENTER ONLY)
Abs Immature Granulocytes: 0.02 K/uL (ref 0.00–0.07)
Basophils Absolute: 0.1 K/uL (ref 0.0–0.1)
Basophils Relative: 1 %
Eosinophils Absolute: 0.1 K/uL (ref 0.0–0.5)
Eosinophils Relative: 1 %
HCT: 37.8 % (ref 36.0–46.0)
Hemoglobin: 12.6 g/dL (ref 12.0–15.0)
Immature Granulocytes: 0 %
Lymphocytes Relative: 35 %
Lymphs Abs: 3.3 K/uL (ref 0.7–4.0)
MCH: 28.6 pg (ref 26.0–34.0)
MCHC: 33.3 g/dL (ref 30.0–36.0)
MCV: 85.9 fL (ref 80.0–100.0)
Monocytes Absolute: 0.6 K/uL (ref 0.1–1.0)
Monocytes Relative: 6 %
Neutro Abs: 5.4 K/uL (ref 1.7–7.7)
Neutrophils Relative %: 57 %
Platelet Count: 359 K/uL (ref 150–400)
RBC: 4.4 MIL/uL (ref 3.87–5.11)
RDW: 12.8 % (ref 11.5–15.5)
WBC Count: 9.4 K/uL (ref 4.0–10.5)
nRBC: 0 % (ref 0.0–0.2)

## 2024-05-10 LAB — IRON AND TIBC
Iron: 38 ug/dL (ref 28–170)
Saturation Ratios: 12 % (ref 10.4–31.8)
TIBC: 321 ug/dL (ref 250–450)
UIBC: 283 ug/dL

## 2024-05-10 LAB — CMP (CANCER CENTER ONLY)
ALT: 29 U/L (ref 0–44)
AST: 22 U/L (ref 15–41)
Albumin: 3.6 g/dL (ref 3.5–5.0)
Alkaline Phosphatase: 101 U/L (ref 38–126)
Anion gap: 8 (ref 5–15)
BUN: 10 mg/dL (ref 6–20)
CO2: 24 mmol/L (ref 22–32)
Calcium: 8.9 mg/dL (ref 8.9–10.3)
Chloride: 105 mmol/L (ref 98–111)
Creatinine: 0.72 mg/dL (ref 0.44–1.00)
GFR, Estimated: >60 mL/min (ref >60)
Glucose, Bld: 104 mg/dL — ABNORMAL HIGH (ref 70–99)
Potassium: 3.8 mmol/L (ref 3.5–5.1)
Sodium: 137 mmol/L (ref 135–145)
Total Bilirubin: 0.5 mg/dL (ref 0.0–1.2)
Total Protein: 7 g/dL (ref 6.5–8.1)

## 2024-05-10 LAB — FERRITIN: Ferritin: 50 ng/mL (ref 11–307)

## 2024-05-10 LAB — VITAMIN B12: Vitamin B-12: 244 pg/mL (ref 180–914)

## 2024-05-10 MED ORDER — CYANOCOBALAMIN 1000 MCG/ML IJ SOLN
1000.0000 ug | INTRAMUSCULAR | Status: DC
  Administered 2024-05-10: 1000 ug via INTRAMUSCULAR
  Filled 2024-05-10: qty 1

## 2024-05-10 NOTE — Progress Notes (Signed)
 Hematology/Oncology Consult note [facility]  Telephone:([phone] [phone]  Patient Care Team: Corwin Antu, FNP as PCP - General (Family Medicine) Melanee Annah BROCKS, MD as Consulting Physician (Oncology)   Name of the patient: Angie Schmidt  [id number]  01-Apr-1986   Date of visit: 05/10/24  Diagnosis-iron deficiency anemia  Chief complaint/ Reason for visit-routine follow-up of iron deficiency anemia  Heme/Onc history: Patient is a 38 year old female with prior history of celiac disease and resultant iron deficiency anemia who is transferring her care from Dr. Brutus.  She has received Feraheme  in the past.Upper endoscopy and colonoscopy from 02/14/2023 by Dr. Unk was normal.  Pathology from duodenal biopsy showed prominence of intraepithelial lymphocytes and partial to subtotal villous blunting.  Could be seen in celiac disease.    Interval history-patient is trying to be compliant with gluten-free diet as much as possible.  She reports ongoing fatigue.  ECOG PS- 0 Pain scale- 0   Review of systems- Review of Systems  Constitutional:  Positive for malaise/fatigue. Negative for chills, fever and weight loss.  HENT:  Negative for congestion, ear discharge and nosebleeds.   Eyes:  Negative for blurred vision.  Respiratory:  Negative for cough, hemoptysis, sputum production, shortness of breath and wheezing.   Cardiovascular:  Negative for chest pain, palpitations, orthopnea and claudication.  Gastrointestinal:  Negative for abdominal pain, blood in stool, constipation, diarrhea, heartburn, melena, nausea and vomiting.  Genitourinary:  Negative for dysuria, flank pain, frequency, hematuria and urgency.  Musculoskeletal:  Negative for back pain, joint pain and myalgias.  Skin:  Negative for rash.  Neurological:  Negative for dizziness, tingling, focal weakness, seizures, weakness and headaches.  Endo/Heme/Allergies:  Does not bruise/bleed  easily.  Psychiatric/Behavioral:  Negative for depression and suicidal ideas. The patient does not have insomnia.       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   Celiac disease    Cholestasis during pregnancy in third trimester 11/05/2018   GERD (gastroesophageal reflux disease)    NO MEDS   Headache    FREQUENT HA'S   Heart murmur    no symptom   Motion sickness    Seizure (HCC)    LAST 2018   Vaginal Pap smear, abnormal      Past Surgical History:  Procedure Laterality Date   COLONOSCOPY WITH PROPOFOL  N/A 02/14/2023   Procedure: COLONOSCOPY WITH PROPOFOL ;  Surgeon: Unk Corinn Skiff, MD;  Location: [facility];  Service: Endoscopy;  Laterality: N/A;   ESOPHAGOGASTRODUODENOSCOPY (EGD) WITH PROPOFOL  N/A 02/14/2023   Procedure: ESOPHAGOGASTRODUODENOSCOPY (EGD) WITH PROPOFOL ;  Surgeon: Unk Corinn Skiff, MD;  Location: [facility];  Service: Endoscopy;  Laterality: N/A;   GALLBLADDER SURGERY      Social History   Socioeconomic History   Marital status: Single    Spouse name: Not on file   Number of children: 4   Years of education: 8   Highest education level: 8th grade  Occupational History   Not on file  Tobacco Use   Smoking status: Former    Current packs/day: 0.00    Average packs/day: 0.3 packs/day for 17.0 years (4.3 ttl pk-yrs)    Types: Cigarettes    Start date:  09/10/2000    Quit date: 09/10/2017    Years since quitting: 6.6    Passive exposure: Never   Smokeless tobacco: Never  Vaping Use   Vaping status: Never Used  Substance and Sexual Activity   Alcohol use: Yes    Alcohol/week: 3.0 standard drinks of alcohol    Types: 3 Glasses of wine per week    Comment: occasionally   Drug use: Yes    Types: Marijuana     Comment: daily   Sexual activity: Yes    Partners: Male    Birth control/protection: None  Other Topics Concern   Not on file  Social History Narrative   Right handed   Lives at home with children   Caffeine use: tea/soda sometimes   Social Drivers of Corporate investment banker Strain: Not on file  Food Insecurity: Not on file  Transportation Needs: Not on file  Physical Activity: Not on file  Stress: Not on file  Social Connections: Unknown (06/04/2020)   Received from [facility]   Social Connections    Frequency of Communication with Friends and Family: Not asked    Frequency of Social Gatherings with Friends and Family: Not asked  Intimate Partner Violence: Not At Risk (04/26/2021)   Humiliation, Afraid, Rape, and Kick questionnaire    Fear of Current or Ex-Partner: No    Emotionally Abused: No    Physically Abused: No    Sexually Abused: No    Family History  Problem Relation Age of Onset   Diabetes Paternal Grandfather    Diabetes Paternal Grandmother    Cancer Maternal Grandmother    Diabetes Maternal Grandmother    Lung disease Maternal Grandmother    Diabetes Maternal Grandfather    Hypertension Maternal Grandfather    Diabetes Father    Cancer Mother    Diabetes Mother    Hypertension Mother    Seizures Mother    Liver disease Mother    Clotting disorder Mother      Current Outpatient Medications:    hydrOXYzine  (VISTARIL ) 25 MG capsule, Take 1 capsule (25 mg total) by mouth every 8 (eight) hours as needed. (Patient not taking: Reported on 05/10/2024), Disp: 30 capsule, Rfl: 0   norethindrone  (ORTHO MICRONOR ) 0.35 MG tablet, Take 1 tablet (0.35 mg total) by mouth daily. (Patient not taking: Reported on 05/10/2024), Disp: 28 tablet, Rfl: 11   ondansetron  (ZOFRAN ) 4 MG tablet, Take 1 tablet (4 mg total) by mouth every 8 (eight) hours as needed for nausea or vomiting. (Patient not taking: Reported on 05/10/2024), Disp: 20 tablet, Rfl: 0   VENTOLIN  HFA 108  (90 Base) MCG/ACT inhaler, Inhale 2 puffs into the lungs every 6 (six) hours as needed for shortness of breath or wheezing. (Patient not taking: Reported on 05/10/2024), Disp: , Rfl:  No current facility-administered medications for this visit.  Facility-Administered Medications Ordered in Other Visits:    cyanocobalamin  (VITAMIN B12) injection 1,000 mcg, 1,000 mcg, Intramuscular, Q30 days, Melanee Annah BROCKS, MD, 1,000 mcg at 05/10/24 1134  Physical exam:  Vitals:   05/10/24 1059  BP: 116/78  Pulse: 77  Resp: 18  Temp: (!) 97 F (36.1 C)  TempSrc: Tympanic  SpO2: 99%  Weight: 190 lb 6.4 oz (86.4 kg)  Height: 5' (1.524 m)   Physical Exam Cardiovascular:     Rate and Rhythm: Normal rate and regular rhythm.     Heart sounds: Normal heart sounds.  Pulmonary:     Effort: Pulmonary effort is  normal.     Breath sounds: Normal breath sounds.  Skin:    General: Skin is warm and dry.  Neurological:     Mental Status: She is alert and oriented to person, place, and time.      I have personally reviewed labs listed below:    Latest Ref Rng & Units 05/10/2024   10:29 AM  CMP  Glucose 70 - 99 mg/dL 895   BUN 6 - 20 mg/dL 10   Creatinine 9.55 - 1.00 mg/dL 9.27   Sodium 864 - 854 mmol/L 137   Potassium 3.5 - 5.1 mmol/L 3.8   Chloride 98 - 111 mmol/L 105   CO2 22 - 32 mmol/L 24   Calcium 8.9 - 10.3 mg/dL 8.9   Total Protein 6.5 - 8.1 g/dL 7.0   Total Bilirubin 0.0 - 1.2 mg/dL 0.5   Alkaline Phos 38 - 126 U/L 101   AST 15 - 41 U/L 22   ALT 0 - 44 U/L 29       Latest Ref Rng & Units 05/10/2024   10:29 AM  CBC  WBC 4.0 - 10.5 K/uL 9.4   Hemoglobin 12.0 - 15.0 g/dL 87.3   Hematocrit 63.9 - 46.0 % 37.8   Platelets 150 - 400 K/uL 359       Assessment and plan- Patient is a 38 y.o. female here for routine follow-up of iron and B12 deficiency anemia  Patient has been getting B12 injections every 3 months and is due for her next dose today.  She will let us  know if she would like to  self administer B12 injections in the future at home.  History of iron Deficiency anemia: Ferritin and iron studies are presently pending.  She is not anemic with an H&H of 12.6/37.8.  CBC ferritin and iron studies in 4 and 8 months and I will see her back in 8 months   Visit Diagnosis 1. Iron deficiency anemia due to chronic blood loss   2. B12 deficiency      Dr. Annah Skene, MD, MPH [facility] at [facility] [phone] 05/10/2024 1:08 PM

## 2024-05-10 NOTE — Progress Notes (Signed)
 Patient reports she's doing okay. She has had some occasional chest pain and states she will reach out to her PCP.

## 2024-05-12 NOTE — Progress Notes (Unsigned)
 Office Visit Note  Patient: Angie Schmidt             Date of Birth: 1986-09-02           MRN: [id number]             PCP: Corwin Antu, FNP Referring: Corwin Antu, FNP Visit Date: 05/13/2024 Occupation: @GUAROCC @  Subjective:  No chief complaint on file.   History of Present Illness: KYRIN GARN is a 38 y.o. female ***     Activities of Daily Living:  Patient reports morning stiffness for *** {minute/hour:19697}.   Patient {ACTIONS;DENIES/REPORTS:21021675::Denies} nocturnal pain.  Difficulty dressing/grooming: {ACTIONS;DENIES/REPORTS:21021675::Denies} Difficulty climbing stairs: {ACTIONS;DENIES/REPORTS:21021675::Denies} Difficulty getting out of chair: {ACTIONS;DENIES/REPORTS:21021675::Denies} Difficulty using hands for taps, buttons, cutlery, and/or writing: {ACTIONS;DENIES/REPORTS:21021675::Denies}  No Rheumatology ROS completed.   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   Celiac disease    Cholestasis during pregnancy in third trimester 11/05/2018   GERD (gastroesophageal reflux disease)    NO MEDS   Headache    FREQUENT HA'S   Heart murmur    no symptom   Motion sickness    Seizure (HCC)    LAST 2018   Vaginal Pap smear, abnormal     Family History  Problem Relation Age of Onset   Diabetes Paternal Grandfather    Diabetes Paternal  Grandmother    Cancer Maternal Grandmother    Diabetes Maternal Grandmother    Lung disease Maternal Grandmother    Diabetes Maternal Grandfather    Hypertension Maternal Grandfather    Diabetes Father    Cancer Mother    Diabetes Mother    Hypertension Mother    Seizures Mother    Liver disease Mother    Clotting disorder Mother    Past Surgical History:  Procedure Laterality Date   COLONOSCOPY WITH PROPOFOL  N/A 02/14/2023   Procedure: COLONOSCOPY WITH PROPOFOL ;  Surgeon: Unk Corinn Skiff, MD;  Location: [facility];  Service: Endoscopy;  Laterality: N/A;   ESOPHAGOGASTRODUODENOSCOPY (EGD) WITH PROPOFOL  N/A 02/14/2023   Procedure: ESOPHAGOGASTRODUODENOSCOPY (EGD) WITH PROPOFOL ;  Surgeon: Unk Corinn Skiff, MD;  Location: [facility];  Service: Endoscopy;  Laterality: N/A;   GALLBLADDER SURGERY     Social History   Social History Narrative   Right handed   Lives at home with children   Caffeine use: tea/soda sometimes   Immunization History  Administered Date(s) Administered   Tdap 02/26/2010, 11/07/2018     Objective: Vital Signs: There were no vitals taken for this visit.   Physical Exam   Musculoskeletal Exam: ***  CDAI Exam: CDAI Score: -- Patient Global: --; Provider Global: -- Swollen: --; Tender: -- Joint Exam 05/13/2024   No joint exam has been documented for this visit   There is currently no information documented on the homunculus. Go to the Rheumatology activity and complete the homunculus joint  exam.  Investigation: No additional findings.  Imaging: No results found.  Recent Labs: Lab Results  Component Value Date   WBC 9.4 05/10/2024   HGB 12.6 05/10/2024   PLT 359 05/10/2024   NA 137 05/10/2024   K 3.8 05/10/2024   CL 105 05/10/2024   CO2 24 05/10/2024   GLUCOSE 104 (H) 05/10/2024   BUN 10 05/10/2024   CREATININE 0.72 05/10/2024   BILITOT 0.5 05/10/2024   ALKPHOS 101 05/10/2024   AST 22 05/10/2024   ALT 29  05/10/2024   PROT 7.0 05/10/2024   ALBUMIN 3.6 05/10/2024   CALCIUM 8.9 05/10/2024   GFRAA >60 11/05/2018    Speciality Comments: No specialty comments available.  Procedures:  No procedures performed Allergies: Wellbutrin [bupropion]   Assessment / Plan:     Visit Diagnoses: No diagnosis found.  Orders: No orders of the defined types were placed in this encounter.  No orders of the defined types were placed in this encounter.   Face-to-face time spent with patient was *** minutes. Greater than 50% of time was spent in counseling and coordination of care.  Follow-Up Instructions: No follow-ups on file.   Lonni LELON Ester, MD  Note - This record has been created using [employer].  Chart creation errors have been sought, but may not always  have been located. Such creation errors do not reflect on  the standard of medical care.

## 2024-05-13 ENCOUNTER — Ambulatory Visit: Attending: Internal Medicine | Admitting: Internal Medicine

## 2024-05-13 ENCOUNTER — Encounter: Payer: Self-pay | Admitting: Internal Medicine

## 2024-05-13 VITALS — BP 97/63 | HR 77 | Resp 14 | Ht 60.0 in | Wt 190.0 lb

## 2024-05-13 DIAGNOSIS — R768 Other specified abnormal immunological findings in serum: Secondary | ICD-10-CM

## 2024-05-13 DIAGNOSIS — L719 Rosacea, unspecified: Secondary | ICD-10-CM | POA: Diagnosis not present

## 2024-05-13 DIAGNOSIS — M25562 Pain in left knee: Secondary | ICD-10-CM

## 2024-05-13 DIAGNOSIS — M25561 Pain in right knee: Secondary | ICD-10-CM | POA: Diagnosis not present

## 2024-05-13 DIAGNOSIS — G8929 Other chronic pain: Secondary | ICD-10-CM

## 2024-05-13 NOTE — Patient Instructions (Signed)
 Straight leg raises This exercise strengthens the muscles in front of your thigh (quadriceps) and the muscles that move your hips (hip flexors). Lie on your back with your left / right leg extended and your other knee bent. Tense the muscles in the front of your left / right thigh. You should see your kneecap slide up or see increased dimpling just above the knee. Your thigh may even shake a bit. Keep these muscles tight as you raise your leg 4-6 inches (10-15 cm) off the floor. Do not let your knee bend. Hold this position for __________ seconds. Keep these muscles tense as you lower your leg. Relax your muscles slowly and completely after each repetition. Repeat __________ times. Complete this exercise __________ times a day. Hamstring, isometric  Lie on your back on a firm surface. Bend your left / right knee about __________ degrees. Dig your left / right heel into the surface as if you are trying to pull it toward your buttocks. Tighten the muscles in the back of your thighs (hamstring) to "dig" as hard as you can without increasing any pain. Hold this position for __________ seconds. Release the tension gradually and allow your muscles to relax completely for __________ seconds after each repetition. Repeat __________ times. Complete this exercise __________ times a day. Squats This exercise strengthens the muscles in front of your thigh and knee (quadriceps). Stand in front of a table, with your feet and knees pointing straight ahead. You may rest your hands on the table for balance but not for support. Slowly bend your knees and lower your hips like you are going to sit in a chair. Keep your weight over your heels, not over your toes. Keep your lower legs upright so they are parallel with the table legs. Do not let your hips go lower than your knees. Do not bend lower than told by your health care provider. If your knee pain increases, do not bend as low. Hold the squat position for  __________ seconds. Slowly push with your legs to return to standing. Do not use your hands to pull yourself to standing. Repeat __________ times. Complete this exercise __________ times a day. Wall slides This exercise strengthens the muscles in front of your thigh and knee (quadriceps). Lean your back against a smooth wall or door, and walk your feet out 18-24 inches (46-61 cm) from it. Place your feet hip-width apart. Slowly slide down the wall or door until your knees bend __________ degrees. Keep your knees over your heels, not over your toes. Keep your knees in line with your hips. Hold this position for __________ seconds. Repeat __________ times. Complete this exercise __________ times a day. Straight leg raises, side-lying This exercise strengthens the muscles that rotate the leg at the hip and move it away from your body (hip abductors). Lie on your side with your left / right leg in the top position. Lie so your head, shoulder, knee, and hip line up. You may bend your bottom knee to help you keep your balance. Roll your hips slightly forward so your hips are stacked directly over each other and your left / right knee is facing forward. Leading with your heel, lift your top leg 4-6 inches (10-15 cm). You should feel the muscles in your outer hip lifting. Do not let your foot drift forward. Do not let your knee roll toward the ceiling. Hold this position for __________ seconds. Slowly return your leg to the starting position. Let your muscles relax  completely after each repetition. Repeat __________ times. Complete this exercise __________ times a day.

## 2024-05-14 ENCOUNTER — Encounter: Payer: Self-pay | Admitting: Family

## 2024-05-14 LAB — C-REACTIVE PROTEIN: CRP: 19.1 mg/L — ABNORMAL HIGH (ref <8.0)

## 2024-05-14 LAB — C3 AND C4
C3 Complement: 176 mg/dL (ref 83–193)
C4 Complement: 24 mg/dL (ref 15–57)

## 2024-05-14 LAB — SEDIMENTATION RATE: Sed Rate: 31 mm/h — ABNORMAL HIGH (ref 0–20)

## 2024-05-15 NOTE — Telephone Encounter (Signed)
 I spoke with pt;pt said on and off for years pt has sharp and dull CP most of time in mid chest when takes deep breath  No SOB. chest pain can be anywhere in chest; pt wonders if could be heartburn or related to breathing; pt is out of inhaler but pt said she is not sure inhaler helps when she does use it.pt has slight non prod cough on and off.no fever. No CP now and last time had CP was 05/13/24. Pt scheduled appt with ONEIDA Patrick FNP on 05/16/24 at 10:40 with UC & ED precautions. Offered pt appt today but pt declined. Sending note to ONEIDA Patrick FNP.

## 2024-05-15 NOTE — Telephone Encounter (Signed)
 Please triage to further evaluate 'chest pains' If stable pt needs appt

## 2024-05-15 NOTE — Telephone Encounter (Signed)
 noted

## 2024-05-16 ENCOUNTER — Encounter: Payer: Self-pay | Admitting: Family

## 2024-05-16 ENCOUNTER — Ambulatory Visit: Admitting: Family

## 2024-05-16 VITALS — BP 122/74 | HR 75 | Temp 98.2°F | Ht 60.0 in | Wt 192.0 lb

## 2024-05-16 DIAGNOSIS — R079 Chest pain, unspecified: Secondary | ICD-10-CM

## 2024-05-16 DIAGNOSIS — J4540 Moderate persistent asthma, uncomplicated: Secondary | ICD-10-CM | POA: Diagnosis not present

## 2024-05-16 DIAGNOSIS — F419 Anxiety disorder, unspecified: Secondary | ICD-10-CM

## 2024-05-16 DIAGNOSIS — R1013 Epigastric pain: Secondary | ICD-10-CM | POA: Diagnosis not present

## 2024-05-16 MED ORDER — CITALOPRAM HYDROBROMIDE 10 MG PO TABS: 10.0000 mg | ORAL_TABLET | Freq: Every day | ORAL | 1 refills | Status: AC

## 2024-05-16 MED ORDER — OMEPRAZOLE 20 MG PO CPDR: 20.0000 mg | DELAYED_RELEASE_CAPSULE | Freq: Every day | ORAL | 0 refills | Status: AC

## 2024-05-16 MED ORDER — VENTOLIN HFA 108 (90 BASE) MCG/ACT IN AERS: 2.0000 | INHALATION_SPRAY | Freq: Four times a day (QID) | RESPIRATORY_TRACT | 1 refills | Status: AC | PRN

## 2024-05-16 NOTE — Patient Instructions (Signed)
 ------------------------------------    Start celexa  10 mg for anxiety and depression. Take 1/2 tablet by mouth once daily for about one week, then increase to 1 full tablet thereafter.   Taking the medicine as directed and not missing any doses is one of the best things you can do to treat your anxiety/depression.  Here are some things to keep in mind:  Side effects (stomach upset, some increased anxiety) may happen before you notice a benefit.  These side effects typically go away over time. Changes to your dose of medicine or a change in medication all together is sometimes necessary Many people will notice an improvement within two weeks but the full effect of the medication can take up to 4-6 weeks Stopping the medication when you start feeling better often results in a return of symptoms. Most people need to be on medication at least 6-12 months If you start having thoughts of hurting yourself or others after starting this medicine, please call me immediately.    ------------------------------------

## 2024-05-16 NOTE — Progress Notes (Signed)
 Established Patient Office Visit  Subjective:      CC:  Chief Complaint  Patient presents with   Medical Management of Chronic Issues    Needs refill on albuterol     HPI: Angie Schmidt is a 38 y.o. female presenting on 05/16/2024 for Medical Management of Chronic Issues (Needs refill on albuterol ) .  Discussed the use of AI scribe software for clinical note transcription with the patient, who gave verbal consent to proceed.  History of Present Illness Angie Schmidt is a 37 year old female with celiac disease and asthma who presents with chest pain.  She experiences sharp, stabbing chest pain primarily in the middle of her chest, occurring a couple of times a week. The pain is fleeting, lasting only a few seconds, and has been increasing in frequency over the past month. She has not used her inhaler recently and is unsure if it helps alleviate the pain. No nocturnal symptoms, heartburn, or burning sensation in the throat.  She has celiac disease and does not adhere strictly to a gluten-free diet, resulting in bloating and abdominal pain. Consuming gluten causes significant bloating and discomfort.  She has increased anxiety and stress due to unemployment and family issues. She feels anxious during chest pain episodes and has been more nauseous lately, attributing it to her nerves. Her lifestyle is sedentary, contributing to feeling out of shape and experiencing shortness of breath with exertion.  She has a history of anemia and is under the care of a rheumatologist for mild inflammation. She receives monthly B12 injections due to low B12 levels. She smokes marijuana daily and reports that edibles make her feel sick.   She is not working and expresses stress related to financial issues and family dynamics. She has applied for jobs but has not received responses, adding to her stress. She acknowledges stress eating and feels better when her mother visits, but her mood declines  after her mother leaves.         Social history:  Relevant past medical, surgical, family and social history reviewed and updated as indicated. Interim medical history since our last visit reviewed.  Allergies and medications reviewed and updated.  DATA REVIEWED: CHART IN EPIC     ROS: Negative unless specifically indicated above in HPI.    Current Outpatient Medications:    citalopram  (CELEXA ) 10 MG tablet, Take 1 tablet (10 mg total) by mouth daily., Disp: 30 tablet, Rfl: 1   omeprazole  (PRILOSEC) 20 MG capsule, Take 1 capsule (20 mg total) by mouth daily., Disp: 30 capsule, Rfl: 0   VENTOLIN  HFA 108 (90 Base) MCG/ACT inhaler, Inhale 2 puffs into the lungs every 6 (six) hours as needed for shortness of breath or wheezing., Disp: 1 each, Rfl: 1        Objective:        BP 122/74 (BP Location: Left Arm, Patient Position: Sitting)   Pulse 75   Temp 98.2 F (36.8 C) (Temporal)   Ht 5' (1.524 m)   Wt 192 lb (87.1 kg)   LMP 04/15/2024 (Approximate)   SpO2 99%   BMI 37.50 kg/m   Physical Exam VITALS: P- 71, BP- 122/74 CARDIOVASCULAR: Regular rate and rhythm, no murmurs. ABDOMEN: Abdominal pain in all quadrants.  Wt Readings from Last 3 Encounters:  05/16/24 192 lb (87.1 kg)  05/13/24 190 lb (86.2 kg)  05/10/24 190 lb 6.4 oz (86.4 kg)    Physical Exam  EKG 71 bpm  NSR unspecified T wave  inversion V1       Results LABS Blood work: Kidney function good, Ferritin normal, B12 low (05/10/2024)  DIAGNOSTIC EKG: Normal rhythm, heart rate 71 bpm (05/16/2024)  Assessment & Plan:   Assessment and Plan Assessment & Plan Chest pain with epigastric and generalized abdominal pain Intermittent sharp stabbing chest pain occurring a couple of times a week, increasing in frequency over the last month. Differential diagnosis includes asthma, anxiety, cardiac issues, and silent heartburn. EKG shows normal rhythm and heart rate, reducing concern for serious cardiac  issues. Abdominal pain is present in all quadrants, with tenderness more pronounced in the upper gastric region. Possible contribution from celiac disease with dietary nonadherence. - Prescribe omeprazole  for two weeks to assess for silent heartburn - Refill albuterol  inhaler - Order H. pylori and alpha-gal tests - Advise on dietary adherence to gluten-free diet  Celiac disease with dietary nonadherence Nonadherence to gluten-free diet leading to bloating and abdominal pain. Emphasized importance of strict gluten-free diet adherence to prevent intestinal damage and potential hospitalization. - Advise strict adherence to gluten-free diet - Encourage keeping a food diary to identify dietary triggers - Educate on gluten-free alternatives and cross-contamination prevention  Moderate persistent asthma Asthma may contribute to chest pain. No recent use of inhaler. No current allergy  symptoms reported. Need to assess if asthma is controlled and if it contributes to chest pain. - Refill albuterol  inhaler - Advise use of inhaler during chest pain episodes to assess effectiveness Consider PFT / referral to pulmonary   Anxiety disorder Increased anxiety due to stressors such as unemployment and family issues, potentially contributing to chest pain and dietary nonadherence. Previous medication for sleep was ineffective. - Prescribe citalopram , starting with half tablet for the first week, then one full tablet daily - Encourage follow-up to assess medication effectiveness - Discuss potential benefits of therapy and anxiety-reducing techniques  Recording duration: 21 minutes      Return in about 2 weeks (around 05/30/2024) for f/u chest pain .     Ginger Patrick, MSN, APRN, FNP-C [facility]

## 2024-05-17 LAB — H. PYLORI BREATH TEST: H. pylori Breath Test: NOT DETECTED

## 2024-05-20 ENCOUNTER — Ambulatory Visit: Payer: Self-pay | Admitting: Family

## 2024-05-22 LAB — ALPHA-GAL PANEL
Allergen, Mutton, f88: <0.1 kU/L
Allergen, Pork, f26: <0.1 kU/L
Beef: <0.1 kU/L
CLASS: 0
CLASS: 0
Class: 0
GALACTOSE-ALPHA-1,3-GALACTOSE IGE*: <0.1 kU/L (ref <0.10)

## 2024-05-22 LAB — INTERPRETATION:

## 2024-05-30 ENCOUNTER — Ambulatory Visit: Payer: Self-pay | Admitting: Family

## 2024-05-30 ENCOUNTER — Ambulatory Visit: Admitting: Family

## 2024-05-30 ENCOUNTER — Encounter: Payer: Self-pay | Admitting: Family

## 2024-05-30 VITALS — BP 112/80 | HR 88 | Temp 98.2°F | Ht 60.0 in | Wt 186.0 lb

## 2024-05-30 DIAGNOSIS — R1032 Left lower quadrant pain: Secondary | ICD-10-CM

## 2024-05-30 DIAGNOSIS — R1031 Right lower quadrant pain: Secondary | ICD-10-CM | POA: Diagnosis not present

## 2024-05-30 DIAGNOSIS — R1030 Lower abdominal pain, unspecified: Secondary | ICD-10-CM

## 2024-05-30 DIAGNOSIS — K900 Celiac disease: Secondary | ICD-10-CM

## 2024-05-30 LAB — COMPREHENSIVE METABOLIC PANEL WITH GFR
ALT: 41 U/L — ABNORMAL HIGH (ref 0–35)
AST: 24 U/L (ref 0–37)
Albumin: 4.2 g/dL (ref 3.5–5.2)
Alkaline Phosphatase: 102 U/L (ref 39–117)
BUN: 12 mg/dL (ref 6–23)
CO2: 29 meq/L (ref 19–32)
Calcium: 8.9 mg/dL (ref 8.4–10.5)
Chloride: 102 meq/L (ref 96–112)
Creatinine, Ser: 0.79 mg/dL (ref 0.40–1.20)
GFR: 95.04 mL/min (ref >60.00)
Glucose, Bld: 100 mg/dL — ABNORMAL HIGH (ref 70–99)
Potassium: 3.7 meq/L (ref 3.5–5.1)
Sodium: 138 meq/L (ref 135–145)
Total Bilirubin: 0.5 mg/dL (ref 0.2–1.2)
Total Protein: 7.9 g/dL (ref 6.0–8.3)

## 2024-05-30 LAB — POCT URINALYSIS DIP (CLINITEK)
Bilirubin, UA: NEGATIVE
Blood, UA: NEGATIVE
Glucose, UA: NEGATIVE mg/dL
Ketones, POC UA: NEGATIVE mg/dL
Leukocytes, UA: NEGATIVE
Nitrite, UA: NEGATIVE
POC PROTEIN,UA: NEGATIVE
Spec Grav, UA: 1.025 (ref 1.010–1.025)
Urobilinogen, UA: 0.2 U/dL
pH, UA: 6 (ref 5.0–8.0)

## 2024-05-30 LAB — CBC WITH DIFFERENTIAL/PLATELET
Basophils Absolute: 0 K/uL (ref 0.0–0.1)
Basophils Relative: 0.6 % (ref 0.0–3.0)
Eosinophils Absolute: 0.1 K/uL (ref 0.0–0.7)
Eosinophils Relative: 1 % (ref 0.0–5.0)
HCT: 39.8 % (ref 36.0–46.0)
Hemoglobin: 13.1 g/dL (ref 12.0–15.0)
Lymphocytes Relative: 31.7 % (ref 12.0–46.0)
Lymphs Abs: 2.4 K/uL (ref 0.7–4.0)
MCHC: 32.9 g/dL (ref 30.0–36.0)
MCV: 85.6 fl (ref 78.0–100.0)
Monocytes Absolute: 0.4 K/uL (ref 0.1–1.0)
Monocytes Relative: 4.6 % (ref 3.0–12.0)
Neutro Abs: 4.8 K/uL (ref 1.4–7.7)
Neutrophils Relative %: 62.1 % (ref 43.0–77.0)
Platelets: 433 K/uL — ABNORMAL HIGH (ref 150.0–400.0)
RBC: 4.65 Mil/uL (ref 3.87–5.11)
RDW: 13.7 % (ref 11.5–15.5)
WBC: 7.7 K/uL (ref 4.0–10.5)

## 2024-05-30 NOTE — Addendum Note (Signed)
 Addended by: ALBINO SHAVER C on: 05/30/2024 04:15 PM   Modules accepted: Orders

## 2024-05-30 NOTE — Progress Notes (Signed)
 Established Patient Office Visit  Subjective:      CC:  Chief Complaint  Patient presents with   Medical Management of Chronic Issues    HPI: Angie Schmidt is a 38 y.o. female presenting on 05/30/2024 for Medical Management of Chronic Issues .  Discussed the use of AI scribe software for note transcription with the patient, who gave verbal consent to proceed.  History of Present Illness RHEBA Schmidt is a 39 year old female with celiac disease who presents with abdominal pain and medication management issues.  She has been experiencing abdominal pain, which she attributes to her celiac disease. She has adhered to a gluten-free diet since her last visit, which she believes has helped reduce her symptoms. She notes a weight loss from 192 to 186 pounds, attributed to dietary changes and reduced inflammation. She has not been eating much because she has to prepare gluten-free meals separately from her family. She describes her bowel movements as frequent and messy. She reports urinating frequently but does not mention urgency or burning.  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 She has been taking citalopram  but finds it makes her sleepy, complicating her work schedule. She has worked a couple of twelve-hour shifts, which disrupted her sleep and medication routine. She also mentions frequent headaches and wonders if they might be related to citalopram , although she has not been taking it consistently.  She has been taking omeprazole  for acid reflux and reports that it has been effective in managing her symptoms. She describes episodes of burping and a sensation of acid reflux, which have improved with the medication. She has not experienced chest pain recently, which she previously associated with heartburn.  Her past medical history includes a negative alpha-gal test, a negative H.  pylori breath test, and mildly elevated inflammatory markers. She has not seen her gastroenterologist in over three years but had a colonoscopy within that time frame.  Wt Readings from Last 3 Encounters:  05/30/24 186 lb (84.4 kg)  05/16/24 192 lb (87.1 kg)  05/13/24 190 lb (86.2 kg)           Social history:  Relevant past medical, surgical, family and social history reviewed and updated as indicated. Interim medical history since our last visit reviewed.  Allergies and medications reviewed and updated.  DATA REVIEWED: CHART IN EPIC     ROS: Negative unless specifically indicated above in HPI.    Current Outpatient Medications:    citalopram  (CELEXA ) 10 MG tablet, Take 1 tablet (10 mg total) by mouth daily., Disp: 30 tablet, Rfl: 1   omeprazole  (PRILOSEC) 20 MG capsule, Take 1 capsule (20 mg total) by mouth daily., Disp: 30 capsule, Rfl: 0   VENTOLIN  HFA 108 (90 Base) MCG/ACT inhaler, Inhale 2 puffs into the lungs every 6 (six) hours as needed for shortness of breath or wheezing., Disp: 1 each, Rfl: 1        Objective:        BP 112/80 (BP Location: Left Arm, Patient Position: Sitting, Cuff Size: Normal)   Pulse 88   Temp 98.2 F (36.8 C) (Temporal)   Ht 5' (1.524 m)   Wt 186 lb (84.4 kg)   LMP 04/15/2024 (Approximate)   SpO2 99%   BMI 36.33 kg/m   Physical Exam MEASUREMENTS: Weight- one eighty something. ABDOMEN: Tenderness throughout the abdomen, more pronounced in the lower abdomen.  Wt Readings from Last 3 Encounters:  05/30/24 186 lb (84.4 kg)  05/16/24 192 lb (87.1 kg)  05/13/24 190 lb (86.2 kg)    Physical Exam Vitals reviewed.  Constitutional:      Appearance: Normal appearance. She is obese.  Abdominal:     General: Bowel sounds are decreased.     Tenderness: There is abdominal tenderness in the right lower quadrant and left lower quadrant. There is no guarding or rebound. Positive signs include McBurney's sign.  Neurological:      Mental Status: She is alert.          Results LABS Alpha-gal: Negative H. pylori breath test: Negative Inflammatory markers: Mildly elevated  DIAGNOSTIC ECG: Normal  Assessment & Plan:   Assessment and Plan Assessment & Plan Celiac disease with recent dietary adherence She has been adhering to a gluten-free diet since the last visit, resulting in weight loss and reduced inflammation. Abdominal pain has improved slightly, and chest pain has resolved, likely due to reduced heartburn from dietary changes. - Continue gluten-free diet - Consider referral to a dietitian for additional dietary support - Follow up with gastroenterologist for ongoing management  Anxiety disorder treated with citalopram  She reports inconsistent use of citalopram  due to fatigue, potentially exacerbated by her new third shift job. Headaches may be a side effect of citalopram . - Try taking citalopram  at night or in the morning to see which timing reduces fatigue - Monitor for headache persistence and adjust medication timing accordingly  Lower abdominal with right and left lower tenderness Pain is significant and worse on the left side. Differential diagnosis includes appendicitis, diverticulitis, or inflammatory bowel disease. - Repeat laboratory tests to check for elevated white blood cells or other indicators of infection or inflammation -stat CT abd pelvis r/o diverticulitis, bowel obstruction, IBD, appendicitis -discussed ER precautions   Recording duration: 9 minutes      Return in about 6 weeks (around 07/11/2024) for f/u anxiety.     Ginger Patrick, MSN, APRN, FNP-C [facility]

## 2024-05-31 ENCOUNTER — Encounter: Payer: Self-pay | Admitting: Family

## 2024-05-31 NOTE — Telephone Encounter (Signed)
 I dont see notes in association with this. I do not see the CT as scheduled are we still pending auth?   Will pt be notified to schedule?  This was requested stat.

## 2024-06-10 ENCOUNTER — Inpatient Hospital Stay: Payer: Self-pay | Attending: Internal Medicine

## 2024-06-19 ENCOUNTER — Encounter: Payer: Self-pay | Admitting: Family

## 2024-07-08 NOTE — Progress Notes (Deleted)
 Medical Nutrition Therapy  Appointment Start time:  ***  Appointment End time:  ***  Primary concerns today: ***  Referral diagnosis: Celiac disease  Preferred learning style: *** (auditory, visual, hands on, no preference indicated) Learning readiness: *** (not ready, contemplating, ready, change in progress)   NUTRITION ASSESSMENT    Clinical Medical Hx: *** Medications: *** Labs: *** Notable Signs/Symptoms: ***  Lifestyle & Dietary Hx ***  Estimated daily fluid intake: *** oz Supplements: *** Sleep: *** Stress / self-care: *** Current average weekly physical activity: ***  24-Hr Dietary Recall First Meal: *** Snack: *** Second Meal: *** Snack: *** Third Meal: *** Snack: *** Beverages: ***  Estimated Energy Needs Calories: *** Carbohydrate: ***g Protein: ***g Fat: ***g   NUTRITION DIAGNOSIS  {CHL AMB NUTRITIONAL DIAGNOSIS:[id number]}   NUTRITION INTERVENTION  Nutrition education (E-1) on the following topics:  ***  Handouts Provided Include  Celiac Disease MNT-AND [email or web address]-leon.[email or web address]   Learning Style & Readiness for Change Teaching method utilized: Visual & Auditory  Demonstrated degree of understanding via: Teach Back  Barriers to learning/adherence to lifestyle change: ***  Goals Established by Pt ***   MONITORING & EVALUATION Dietary intake, weekly physical activity, and *** in ***.  Next Steps  Patient is to ***.

## 2024-07-10 ENCOUNTER — Inpatient Hospital Stay: Payer: Self-pay | Attending: Internal Medicine

## 2024-07-15 ENCOUNTER — Ambulatory Visit: Admitting: Dietician

## 2024-07-15 DIAGNOSIS — K900 Celiac disease: Secondary | ICD-10-CM

## 2024-08-09 ENCOUNTER — Inpatient Hospital Stay: Payer: Self-pay | Attending: Internal Medicine

## 2024-09-09 ENCOUNTER — Inpatient Hospital Stay: Payer: Self-pay | Attending: Internal Medicine

## 2025-01-08 ENCOUNTER — Ambulatory Visit: Payer: Self-pay | Admitting: Oncology

## 2025-01-08 ENCOUNTER — Other Ambulatory Visit: Payer: Self-pay
